# Patient Record
Sex: Female | Born: 1950 | Race: White | Hispanic: No | Marital: Married | Smoking: Never smoker
Health system: Southern US, Community
[De-identification: ages and names within clinical notes are randomized; demographics above are authoritative.]

## PROBLEM LIST (undated history)

## (undated) DIAGNOSIS — I10 Essential (primary) hypertension: Secondary | ICD-10-CM

## (undated) DIAGNOSIS — R55 Syncope and collapse: Secondary | ICD-10-CM

## (undated) DIAGNOSIS — K635 Polyp of colon: Secondary | ICD-10-CM

## (undated) DIAGNOSIS — R112 Nausea with vomiting, unspecified: Secondary | ICD-10-CM

## (undated) DIAGNOSIS — A281 Cat-scratch disease: Secondary | ICD-10-CM

## (undated) DIAGNOSIS — M1990 Unspecified osteoarthritis, unspecified site: Secondary | ICD-10-CM

## (undated) DIAGNOSIS — F410 Panic disorder [episodic paroxysmal anxiety] without agoraphobia: Secondary | ICD-10-CM

## (undated) DIAGNOSIS — Z9889 Other specified postprocedural states: Secondary | ICD-10-CM

## (undated) DIAGNOSIS — Z9289 Personal history of other medical treatment: Secondary | ICD-10-CM

## (undated) DIAGNOSIS — K219 Gastro-esophageal reflux disease without esophagitis: Secondary | ICD-10-CM

## (undated) HISTORY — PX: DILATION AND CURETTAGE OF UTERUS: SHX78

## (undated) HISTORY — DX: Cat-scratch disease: A28.1

## (undated) HISTORY — DX: Unspecified osteoarthritis, unspecified site: M19.90

## (undated) HISTORY — DX: Essential (primary) hypertension: I10

## (undated) HISTORY — DX: Panic disorder (episodic paroxysmal anxiety): F41.0

## (undated) HISTORY — DX: Syncope and collapse: R55

## (undated) HISTORY — PX: GANGLION CYST EXCISION: SHX1691

## (undated) HISTORY — DX: Gastro-esophageal reflux disease without esophagitis: K21.9

## (undated) HISTORY — DX: Personal history of other medical treatment: Z92.89

## (undated) HISTORY — PX: OTHER SURGICAL HISTORY: SHX169

## (undated) HISTORY — PX: BREAST EXCISIONAL BIOPSY: SUR124

## (undated) HISTORY — DX: Polyp of colon: K63.5

## (undated) HISTORY — PX: COLONOSCOPY: SHX174

---

## 1971-06-11 HISTORY — PX: BREAST EXCISIONAL BIOPSY: SUR124

## 1982-06-10 HISTORY — PX: COLON SURGERY: SHX602

## 2004-01-27 ENCOUNTER — Encounter: Admission: RE | Admit: 2004-01-27 | Discharge: 2004-01-27 | Payer: Self-pay | Admitting: Family Medicine

## 2006-10-01 ENCOUNTER — Encounter: Admission: RE | Admit: 2006-10-01 | Discharge: 2006-10-01 | Payer: Self-pay | Admitting: Gastroenterology

## 2007-05-04 ENCOUNTER — Ambulatory Visit: Payer: Self-pay | Admitting: Vascular Surgery

## 2009-03-19 ENCOUNTER — Encounter: Admission: RE | Admit: 2009-03-19 | Discharge: 2009-03-19 | Payer: Self-pay | Admitting: Orthopedic Surgery

## 2009-10-22 ENCOUNTER — Encounter: Admission: RE | Admit: 2009-10-22 | Discharge: 2009-10-22 | Payer: Self-pay | Admitting: Orthopaedic Surgery

## 2010-02-20 ENCOUNTER — Encounter: Admission: RE | Admit: 2010-02-20 | Discharge: 2010-02-20 | Payer: Self-pay | Admitting: Family Medicine

## 2010-07-03 ENCOUNTER — Ambulatory Visit: Payer: Self-pay | Admitting: Cardiology

## 2010-10-23 NOTE — Procedures (Signed)
RENAL ARTERY DUPLEX EVALUATION   INDICATION:  Uncontrolled hypertension.   HISTORY:  Diabetes:  No.  Cardiac:  No.  Hypertension:  Yes.  Smoking:  No.   RENAL ARTERY DUPLEX FINDINGS:  Aorta-Proximal:  76 cm/s  Aorta-Mid:  101 cm/s  Aorta-Distal:  110 cm/s  Celiac Artery Origin:  149 cm/s  SMA Origin:  115 cm/s                                    RIGHT               LEFT  Renal Artery Origin:             95 cm/s             154 cm/s  Renal Artery Proximal:           92 cm/s             128 cm/s  Renal Artery Mid:                83 cm/s             122 cm/s  Renal Artery Distal:             66 cm/s             69 cm/s  Hilar Acceleration Time (AT):    m/s2                m/s2  Renal-Aortic Ratio (RAR):        1.2                 2  Kidney Size:                     11.4 cm X  cm       11.6 cm X  cm  End Diastolic Ratio (EDR):  Resistive Index (RI):            0.61                0.72   IMPRESSION:  No evidence of significant renal artery stenosis noted  bilaterally.   Bilateral kidneys measured within normal limits (right measured 11.4 cm  and left measured 11.6 cm).  Bilateral resistive index within normal  limits.   ___________________________________________  Quita Skye Hart [place], M.D.   MG/MEDQ  D:  05/04/2007  T:  05/05/2007  Job:  [id number]

## 2011-08-14 ENCOUNTER — Other Ambulatory Visit: Payer: Self-pay | Admitting: Orthopaedic Surgery

## 2011-08-14 DIAGNOSIS — M545 Low back pain, unspecified: Secondary | ICD-10-CM

## 2011-08-17 ENCOUNTER — Ambulatory Visit
Admission: RE | Admit: 2011-08-17 | Discharge: 2011-08-17 | Disposition: A | Discharge status: Home / Self Care | Payer: 59 | Source: Ambulatory Visit | Attending: Orthopaedic Surgery | Admitting: Orthopaedic Surgery

## 2011-08-17 DIAGNOSIS — M545 Low back pain, unspecified: Secondary | ICD-10-CM

## 2011-10-18 ENCOUNTER — Encounter: Payer: Self-pay | Admitting: *Deleted

## 2012-02-20 ENCOUNTER — Encounter: Payer: Self-pay | Admitting: Cardiology

## 2012-11-08 DIAGNOSIS — R55 Syncope and collapse: Secondary | ICD-10-CM

## 2012-11-08 HISTORY — DX: Syncope and collapse: R55

## 2012-12-02 ENCOUNTER — Ambulatory Visit (INDEPENDENT_AMBULATORY_CARE_PROVIDER_SITE_OTHER): Payer: Managed Care, Other (non HMO) | Admitting: Physician Assistant

## 2012-12-02 ENCOUNTER — Encounter: Payer: Self-pay | Admitting: Physician Assistant

## 2012-12-02 VITALS — BP 127/89 | HR 84 | Ht 66.0 in | Wt 197.8 lb

## 2012-12-02 DIAGNOSIS — E669 Obesity, unspecified: Secondary | ICD-10-CM

## 2012-12-02 DIAGNOSIS — R55 Syncope and collapse: Secondary | ICD-10-CM

## 2012-12-02 DIAGNOSIS — I10 Essential (primary) hypertension: Secondary | ICD-10-CM

## 2012-12-02 NOTE — Patient Instructions (Addendum)
Will obtain labs today and call you with the results (bmet/cbc)  Your physician recommends that you continue on your current medications as directed. Please refer to the Current Medication list given to you today.  Your physician has requested that you have an echocardiogram. Echocardiography is a painless test that uses sound waves to create images of your heart. It provides your doctor with information about the size and shape of your heart and how well your heart's chambers and valves are working. This procedure takes approximately one hour. There are no restrictions for this procedure.  Your physician has recommended that you wear an event monitor. Event monitors are medical devices that record the heart's electrical activity. Doctors most often [place] these monitors to diagnose arrhythmias. Arrhythmias are problems with the speed or rhythm of the heartbeat. The monitor is a small, portable device. You can wear one while you do your normal daily activities. This is usually used to diagnose what is causing palpitations/syncope (passing out).  Return January 29, 2013 at 9:30 to see Dr Swaziland

## 2012-12-02 NOTE — Progress Notes (Addendum)
[address] [place], [place]  [address] Phone: [phone] Fax:  [phone]  Date:  12/02/2012   ID:  Sharon Combs, DOB 22-Apr-1951, MRN [id number]  PCP:  Ethel Rana  Cardiologist:  Dr. Peter Swaziland     History of Present Illness: Sharon Combs is a 62 y.o. female who returns for evaluation of syncope.   She has been followed in the past by Dr. Swaziland for hypertension. Last seen in 2012. She has used phentermine for weight loss and had recently started back on this per the bariatric clinic (according to the notes).  She was seen recently by her PCP after a syncopal episode. She is referred for further evaluation.  Patient notes she had been dieting (1200 kCal per day).  Two weeks ago, she awoke and showered as normal.  It was not a particularly hot shower.  She did not feel good.  Of note, she had had a HA off and on for 3 weeks.  She felt bad in the shower and lightheaded.  She got out and held on to the sink.  She felt near syncopal and then proceeded to pass out.  She hit the toilet and broke the seat.  States she hit her head but had a towel around her head.  She did presumably break a couple ribs on the right.  She felt confused upon awakening.  She felt weak for several hours.  Since she has felt well without recurrence.  She stopped the phentermine.  She denies chest pain, dyspnea, orthopnea, PND, edema.     Wt Readings from Last 3 Encounters:  12/02/12 197 lb 12.8 oz (89.721 kg)     Past Medical History  Diagnosis Date  . Hypertension     renal art dopplers 04/2007: normal  . GERD (gastroesophageal reflux disease)   . Panic attack   . DJD (degenerative joint disease)     cervical spine  . Cat scratch fever     as a child  . Colon polyps     Current Outpatient Prescriptions  Medication Sig Dispense Refill  . ALPRAZolam (XANAX) 0.25 MG tablet Take 0.25 mg by mouth at bedtime as needed.      . cyclobenzaprine (FLEXERIL) 10 MG tablet Take 10  mg by mouth as needed for muscle spasms.      Marland Kitchen estrogens, conjugated, (PREMARIN) 0.3 MG tablet Take 0.3 mg by mouth daily. Take daily for 21 days then do not take for 7 days.      Marland Kitchen HYDROcodone-acetaminophen (NORCO/VICODIN) 5-325 MG per tablet Take 1 tablet by mouth every 6 (six) hours as needed for pain.      . Misc Natural Products (OSTEO BI-FLEX JOINT SHIELD PO) Take by mouth daily.      . Multiple Vitamin (MULTIVITAMIN) tablet Take 1 tablet by mouth daily.      Marland Kitchen olmesartan-hydrochlorothiazide (BENICAR HCT) 40-25 MG per tablet Take 1 tablet by mouth daily.       No current facility-administered medications for this visit.    Allergies:    Allergies  Allergen Reactions  . Entex T (Pseudoephedrine-Guaifenesin)     Social History:  The patient  reports that she has never smoked. She does not have any smokeless tobacco history on file. She reports that she does not drink alcohol.   Family History:  The patient's family history includes Arrhythmia in her mother; Heart disease in her father; Hypertension in an unspecified family member; Pancreatic cancer in an  unspecified family member; and Pneumonia in an unspecified family member.   ROS:  Please see the history of present illness.      All other systems reviewed and negative.   PHYSICAL EXAM: VS:  BP 137/86  Pulse 84  Ht 5\' 6"  (1.676 m)  Wt 197 lb 12.8 oz (89.721 kg)  BMI 31.94 kg/m2  Filed Vitals:   12/02/12 1501 12/02/12 1502 12/02/12 1503 12/02/12 1504  BP: 121/86 137/86 132/92 127/89  Pulse: 83 84 84   Height:      Weight:         Well nourished, well developed, in no acute distress HEENT: normal Neck: no JVD Vascular:  No carotid bruits; carotid upstrokes normal bilat Endocrine:  No TM Cardiac:  normal S1, S2; RRR; 1/6 systolic murmur at RUSB Lungs:  clear to auscultation bilaterally, no wheezing, rhonchi or rales Abd: soft, nontender, no hepatomegaly Ext: no edema Skin: warm and dry Neuro:  CNs 2-12 intact, no  focal abnormalities noted  EKG:  NSR, HR 87, no change from prior tracings     ASSESSMENT AND PLAN:  1. Syncope:  Given hx of recent dieting with 1200 kCal per day, phentermine use, HAs (?viral syndrome) and prodrome, suspect vasovagal syncope.  However, she has a murmur on exam.  Will check echo to r/o reduced EF and valvular abnormalities.  She notes episodes of near syncope in the past.  She is not on AV nodal blocking agents.  Orthostatic VS normal today.  Will schedule an Event monitor to rule out arrhythmia.  It should be noted, that her episodes of syncope are very infrequent. Therefore a Holter monitor would not be appropriate. An event monitor would yield a better assessment.  Check CBC and BMET.   2. Hypertension:  Controlled.  Continue current therapy.  3. Obesity:  Recommend remaining off of phentermine for now.  4. Disposition:  F/u with Dr. Peter Swaziland in 4-6 weeks.    Signed, Tereso Newcomer, PA-C  12/02/2012 2:18 PM

## 2012-12-09 ENCOUNTER — Telehealth: Payer: Self-pay | Admitting: *Deleted

## 2012-12-09 ENCOUNTER — Ambulatory Visit (HOSPITAL_COMMUNITY): Payer: Managed Care, Other (non HMO) | Attending: Physician Assistant | Admitting: Radiology

## 2012-12-09 ENCOUNTER — Ambulatory Visit (INDEPENDENT_AMBULATORY_CARE_PROVIDER_SITE_OTHER): Payer: Managed Care, Other (non HMO) | Admitting: *Deleted

## 2012-12-09 ENCOUNTER — Encounter: Payer: Self-pay | Admitting: *Deleted

## 2012-12-09 ENCOUNTER — Encounter (INDEPENDENT_AMBULATORY_CARE_PROVIDER_SITE_OTHER): Payer: Managed Care, Other (non HMO)

## 2012-12-09 DIAGNOSIS — R011 Cardiac murmur, unspecified: Secondary | ICD-10-CM

## 2012-12-09 DIAGNOSIS — E669 Obesity, unspecified: Secondary | ICD-10-CM | POA: Insufficient documentation

## 2012-12-09 DIAGNOSIS — R55 Syncope and collapse: Secondary | ICD-10-CM

## 2012-12-09 DIAGNOSIS — I10 Essential (primary) hypertension: Secondary | ICD-10-CM

## 2012-12-09 LAB — CBC WITH DIFFERENTIAL/PLATELET
Basophils Relative: 0.4 % (ref 0.0–3.0)
Eosinophils Absolute: 0 10*3/uL (ref 0.0–0.7)
Eosinophils Relative: 0.5 % (ref 0.0–5.0)
MCHC: 33.2 g/dL (ref 30.0–36.0)
MCV: 91.5 fl (ref 78.0–100.0)
Monocytes Absolute: 0.6 10*3/uL (ref 0.1–1.0)
Neutro Abs: 3.4 10*3/uL (ref 1.4–7.7)
Platelets: 202 10*3/uL (ref 150.0–400.0)
WBC: 5.9 10*3/uL (ref 4.5–10.5)

## 2012-12-09 LAB — BASIC METABOLIC PANEL
Calcium: 9.5 mg/dL (ref 8.4–10.5)
Glucose, Bld: 101 mg/dL — ABNORMAL HIGH (ref 70–99)
Potassium: 3.4 mEq/L — ABNORMAL LOW (ref 3.5–5.1)

## 2012-12-09 NOTE — Progress Notes (Signed)
Echocardiogram performed.  

## 2012-12-09 NOTE — Telephone Encounter (Signed)
lmptcb to find out if she had her lab work done on 12/02/12, we do not see her results yet, but does state her labs have been collected

## 2012-12-09 NOTE — Telephone Encounter (Signed)
Message copied by Tarri Fuller on Wed Dec 09, 2012  8:34 AM ------      Message from: [place], [place] T      Created: Tue Dec 08, 2012  5:12 PM       Can we check on bmet and cbc results?      Thanks      Acupuncturist            ----- Message -----         From: SYSTEM         Sent: 12/07/2012  12:06 AM           To: Beatrice Lecher, PA-C                   ------

## 2012-12-09 NOTE — Progress Notes (Signed)
Patient ID: Sharon Combs, female   DOB: [birth year], 62 y.o.   MRN: [id number] E-Cardio Bramer 30 day event monitor applied to patient.

## 2012-12-10 ENCOUNTER — Other Ambulatory Visit: Payer: Self-pay

## 2012-12-10 MED ORDER — POTASSIUM CHLORIDE ER 10 MEQ PO TBCR: 10.0000 meq | EXTENDED_RELEASE_TABLET | Freq: Every day | ORAL | Status: DC

## 2012-12-11 ENCOUNTER — Encounter: Payer: Self-pay | Admitting: Physician Assistant

## 2012-12-14 ENCOUNTER — Telehealth: Payer: Self-pay | Admitting: *Deleted

## 2012-12-14 NOTE — Telephone Encounter (Signed)
Message copied by Tarri Fuller on Mon Dec 14, 2012 10:43 AM ------      Message from: [place], [place] T      Created: Fri Dec 11, 2012  7:44 PM       Please notify patient echocardiogram normal with normal LV function and normal valves      Continue with current treatment plan.      Tereso Newcomer, PA-C        12/11/2012 7:44 PM ------

## 2012-12-14 NOTE — Telephone Encounter (Signed)
lmom normal echo, no changes to be made

## 2013-01-19 ENCOUNTER — Telehealth: Payer: Self-pay | Admitting: Cardiology

## 2013-01-19 NOTE — Telephone Encounter (Signed)
Returned call to patient she stated her B/P has been low.Stated ranging from 114/70 to 96/65 pulse 87.Stated she feels tired.Advised Dr.Jordan out of office today will check with him tomorrow 01/20/13 and call her back.

## 2013-01-19 NOTE — Telephone Encounter (Signed)
New prob  Pt is concerned because her BP has been going as low at 96/65. She would like to speak with a nurse

## 2013-01-21 ENCOUNTER — Telehealth: Payer: Self-pay

## 2013-01-21 MED ORDER — OLMESARTAN MEDOXOMIL-HCTZ 20-12.5 MG PO TABS: 1.0000 | ORAL_TABLET | Freq: Every day | ORAL | Status: DC

## 2013-01-21 NOTE — Telephone Encounter (Signed)
Patient called Dr.Jordan reviewed monitor which revealed normal sinus rhythm.

## 2013-01-21 NOTE — Telephone Encounter (Signed)
Returned call to patient spoke to Dr.Jordan he advised to decrease benicar hct 20/12.5 mg daily.Advised to continue to monitor B/P,keep diary and bring to your appointment with Dr.Jordan 01/29/13.

## 2013-01-29 ENCOUNTER — Encounter: Payer: Self-pay | Admitting: Cardiology

## 2013-01-29 ENCOUNTER — Ambulatory Visit (INDEPENDENT_AMBULATORY_CARE_PROVIDER_SITE_OTHER): Payer: Managed Care, Other (non HMO) | Admitting: Cardiology

## 2013-01-29 VITALS — BP 124/72 | HR 79 | Ht 66.0 in | Wt 199.8 lb

## 2013-01-29 DIAGNOSIS — R55 Syncope and collapse: Secondary | ICD-10-CM

## 2013-01-29 DIAGNOSIS — I10 Essential (primary) hypertension: Secondary | ICD-10-CM

## 2013-01-29 LAB — BASIC METABOLIC PANEL
Chloride: 102 mEq/L (ref 96–112)
Creatinine, Ser: 0.9 mg/dL (ref 0.4–1.2)
Sodium: 137 mEq/L (ref 135–145)

## 2013-01-29 NOTE — Patient Instructions (Signed)
We will check your potassium today.  Continue your current therapy  I will see you in 6 months.

## 2013-01-29 NOTE — Progress Notes (Signed)
Sharon Combs Date of Birth: 1950-07-20 Medical Record #[id number]  History of Present Illness: Sharon Combs is seen today for followup. She has a history of hypertension. In June she had an episode of syncope. She had just gotten out of a shower. She suddenly felt hot and lightheaded and passed out hitting her chest against the vanity. She had been taking phentermine for weight loss and this was stopped. She was not significantly orthostatic. Subsequent evaluation with an echocardiogram was normal. She also wore an event monitor which was normal. She has had no recurrent episodes of syncope or near syncope. Her Benicar HCT was reduced. Blood pressure has remained in good control. She was mildly hypokalemic and her potassium has been repleted.  Current Outpatient Prescriptions on File Prior to Visit  Medication Sig Dispense Refill  . ALPRAZolam (XANAX) 0.25 MG tablet Take 0.25 mg by mouth at bedtime as needed.      . cyclobenzaprine (FLEXERIL) 10 MG tablet Take 10 mg by mouth as needed for muscle spasms.      Marland Kitchen estrogens, conjugated, (PREMARIN) 0.3 MG tablet Take 0.3 mg by mouth daily. Take daily for 21 days then do not take for 7 days.      Marland Kitchen HYDROcodone-acetaminophen (NORCO/VICODIN) 5-325 MG per tablet Take 1 tablet by mouth every 6 (six) hours as needed for pain.      . Misc Natural Products (OSTEO BI-FLEX JOINT SHIELD PO) Take by mouth daily.      . Multiple Vitamin (MULTIVITAMIN) tablet Take 1 tablet by mouth daily.      Marland Kitchen olmesartan-hydrochlorothiazide (BENICAR HCT) 20-12.5 MG per tablet Take 1 tablet by mouth daily.  90 tablet  3  . potassium chloride (K-DUR) 10 MEQ tablet Take 1 tablet (10 mEq total) by mouth daily.  30 tablet  6   No current facility-administered medications on file prior to visit.    Allergies  Allergen Reactions  . Entex T [Pseudoephedrine-Guaifenesin]     Past Medical History  Diagnosis Date  . Hypertension     renal art dopplers 04/2007: normal  . GERD  (gastroesophageal reflux disease)   . Panic attack   . DJD (degenerative joint disease)     cervical spine  . Cat scratch fever     as a child  . Colon polyps   . History of echocardiogram     Echo 7/14: EF 60-65%, normal wall motion    Past Surgical History  Procedure Laterality Date  . Total hysterectomy - unknown type      History  Smoking status  . Never Smoker   Smokeless tobacco  . Not on file    History  Alcohol Use No    Family History  Problem Relation Age of Onset  . Pneumonia    . Pancreatic cancer    . Hypertension    . Arrhythmia Mother     "tachycardia"  . Heart disease Father     Review of Systems: As noted in history of present illness.  All other systems were reviewed and are negative.  Physical Exam: BP 124/72  Pulse 79  Ht 5\' 6"  (1.676 m)  Wt 199 lb 12.8 oz (90.629 kg)  BMI 32.26 kg/m2  SpO2 98% She is a pleasant white female in no acute distress. HEENT: Normal Neck: No JVD or bruits. No adenopathy. Lungs: Clear Cardiac: Regular rate and rhythm. Normal S1 and S2. No gallop, murmur, or click. Abdomen: Soft and nontender. No masses or bruits. Extremities: No  cyanosis or edema. Pulses are 2+. Skin: Warm and dry Neuro: Alert and oriented x3. Cranial nerves II through XII are intact. LABORATORY DATA:   Assessment / Plan: 1. Syncopal episode. Probably related to vasodilatation and maybe some volume contraction with her HCTZ. No recurrent symptoms. Blood pressure is well controlled on lower dose of medication. 2. Hypertension-well controlled  Plan: We'll continue to observe for now. She is to call if she has any recurrent syncopal episodes. We will check a basic metabolic panel today. I will followup in 6 months.

## 2013-06-25 ENCOUNTER — Telehealth: Payer: Self-pay | Admitting: Cardiology

## 2013-06-25 NOTE — Telephone Encounter (Signed)
Pt states she has not been feeling well for 1 week. C/o face pressure/ ears red and had an episode of dizziness and nausea. Pt states yesterday her bp was 139/102, pt had Benicar hctz reduced due to hypotension.  Today 151/95 pt took bp meds 30 minutes ago/ pt is now at work and can not get another bp. High sodium foods reviewed, pt seems to avoid sodium rich foods. Caffeine reviewed, pt drinks coffe in am/ 1-2 cups. Pt was advised to get 3 bp/p readings daily over the weekend and call Monday with the results to Dr Lolita RiegerJordan/ nurse. Pt was told to walk 20 minutes a day to reduce stress. Pt concerned about MI because of SX of nausea and dizziness, pt was reassured that nausea and dizziness can be from a vas number of reasons but Her BP may have been elevated at the time, pt denies sob/ cp/ arm pain/ no other symptoms. Pt was told to call with further concerns over the weekend. Pt verbalized understanding and was accepting of plan.

## 2013-06-25 NOTE — Telephone Encounter (Signed)
New message  Patient is very concerned about her elevated BP. This has been going on since yesterday. Please call and advise.

## 2013-07-02 NOTE — Telephone Encounter (Signed)
Patient called no answer.LMTC. 

## 2013-07-15 NOTE — Telephone Encounter (Signed)
Patient called no answer.Left message on personal answering machine, calling to see how you are doing and need to call me back time for your follow up appointment with Dr.Jordan.

## 2013-07-16 ENCOUNTER — Other Ambulatory Visit: Payer: Self-pay | Admitting: Cardiology

## 2013-07-22 ENCOUNTER — Telehealth: Payer: Self-pay | Admitting: Cardiology

## 2013-07-22 NOTE — Telephone Encounter (Signed)
Returned call to patient she stated she is feeling better, B/P is up and down.Occasional dizziness.Follow up appointment scheduled with Dr.Jordan 08/27/13.

## 2013-07-22 NOTE — Telephone Encounter (Signed)
New message  ° ° °Returning Cheryls call  °

## 2013-08-21 ENCOUNTER — Other Ambulatory Visit: Payer: Self-pay | Admitting: Cardiology

## 2013-08-27 ENCOUNTER — Ambulatory Visit (INDEPENDENT_AMBULATORY_CARE_PROVIDER_SITE_OTHER): Payer: Managed Care, Other (non HMO) | Admitting: Cardiology

## 2013-08-27 ENCOUNTER — Encounter: Payer: Self-pay | Admitting: Cardiology

## 2013-08-27 VITALS — BP 129/84 | HR 90 | Ht 66.0 in | Wt 201.8 lb

## 2013-08-27 DIAGNOSIS — R55 Syncope and collapse: Secondary | ICD-10-CM

## 2013-08-27 DIAGNOSIS — I10 Essential (primary) hypertension: Secondary | ICD-10-CM

## 2013-08-27 NOTE — Patient Instructions (Signed)
Continue your current therapy  I will see you as needed. 

## 2013-08-28 NOTE — Progress Notes (Signed)
Sharon EddyEvelyn S Combs Date of Birth: 03/29/1951 Medical Record #[id number]  History of Present Illness: Sharon Combs is seen today for followup. She has a history of hypertension. In June 2014 she had an episode of syncope.  She was not significantly orthostatic. Subsequent evaluation with an echocardiogram was normal. She also wore an event monitor which was normal.  Her Benicar HCT was reduced. Blood pressure has remained in good control. She was mildly hypokalemic and her potassium has been repleted. She has no recurrent lightheadedness or syncope. She does complain of some nausea and allergies.   Current Outpatient Prescriptions on File Prior to Visit  Medication Sig Dispense Refill  . ALPRAZolam (XANAX) 0.25 MG tablet Take 0.25 mg by mouth at bedtime as needed.      . cyclobenzaprine (FLEXERIL) 10 MG tablet Take 10 mg by mouth as needed for muscle spasms.      Marland Kitchen. estrogens, conjugated, (PREMARIN) 0.3 MG tablet Take 0.3 mg by mouth daily. Take daily for 21 days then do not take for 7 days.      Marland Kitchen. KLOR-CON 10 10 MEQ tablet TAKE 1 TABLET EVERY DAY  30 tablet  0  . Misc Natural Products (OSTEO BI-FLEX JOINT SHIELD PO) Take by mouth daily.      . Multiple Vitamin (MULTIVITAMIN) tablet Take 1 tablet by mouth daily.      Marland Kitchen. olmesartan-hydrochlorothiazide (BENICAR HCT) 20-12.5 MG per tablet Take 1 tablet by mouth daily.  90 tablet  3   No current facility-administered medications on file prior to visit.    Allergies  Allergen Reactions  . Entex T [Pseudoephedrine-Guaifenesin]     Past Medical History  Diagnosis Date  . Hypertension     renal art dopplers 04/2007: normal  . GERD (gastroesophageal reflux disease)   . Panic attack   . DJD (degenerative joint disease)     cervical spine  . Cat scratch fever     as a child  . Colon polyps   . History of echocardiogram     Echo 7/14: EF 60-65%, normal wall motion  . Syncope 6/14    Past Surgical History  Procedure Laterality Date  . Total  hysterectomy - unknown type      History  Smoking status  . Never Smoker   Smokeless tobacco  . Not on file    History  Alcohol Use No    Family History  Problem Relation Age of Onset  . Pneumonia    . Pancreatic cancer    . Hypertension    . Arrhythmia Mother     "tachycardia"  . Heart disease Father     Review of Systems: As noted in history of present illness.  All other systems were reviewed and are negative.  Physical Exam: BP 129/84  Pulse 90  Ht 5\' 6"  (1.676 m)  Wt 201 lb 12.8 oz (91.536 kg)  BMI 32.59 kg/m2 She is a pleasant white female in no acute distress. HEENT: Normal Neck: No JVD or bruits. No adenopathy. Lungs: Clear Cardiac: Regular rate and rhythm. Normal S1 and S2. No gallop, murmur, or click. Abdomen: Soft and nontender. No masses or bruits. Extremities: No cyanosis or edema. Pulses are 2+. Skin: Warm and dry Neuro: Alert and oriented x3. Cranial nerves II through XII are intact. LABORATORY DATA:   Assessment / Plan: 1. Syncopal episode. Probably related to vasodilatation and maybe some volume contraction with  HCTZ. No recurrent symptoms. Blood pressure is well controlled on lower dose of  medication. No further cardiac workup indicated. She will follow up with her primary care and I will see her as needed.  2. Hypertension-well controlled

## 2013-09-24 ENCOUNTER — Other Ambulatory Visit: Payer: Self-pay | Admitting: Cardiology

## 2013-10-01 ENCOUNTER — Other Ambulatory Visit: Payer: Self-pay | Admitting: Physician Assistant

## 2013-10-01 DIAGNOSIS — Z1231 Encounter for screening mammogram for malignant neoplasm of breast: Secondary | ICD-10-CM

## 2013-10-08 ENCOUNTER — Other Ambulatory Visit: Payer: Self-pay | Admitting: Orthopedic Surgery

## 2013-10-14 ENCOUNTER — Ambulatory Visit
Admission: RE | Admit: 2013-10-14 | Discharge: 2013-10-14 | Disposition: A | Discharge status: Home / Self Care | Payer: Managed Care, Other (non HMO) | Source: Ambulatory Visit | Attending: Physician Assistant | Admitting: Physician Assistant

## 2013-10-14 ENCOUNTER — Encounter (INDEPENDENT_AMBULATORY_CARE_PROVIDER_SITE_OTHER): Payer: Self-pay

## 2013-10-14 DIAGNOSIS — Z1231 Encounter for screening mammogram for malignant neoplasm of breast: Secondary | ICD-10-CM

## 2013-10-20 ENCOUNTER — Encounter (HOSPITAL_BASED_OUTPATIENT_CLINIC_OR_DEPARTMENT_OTHER): Payer: Self-pay | Admitting: *Deleted

## 2013-10-20 NOTE — Progress Notes (Signed)
Working-will need istat-ekg done 7/14

## 2013-10-21 ENCOUNTER — Other Ambulatory Visit: Payer: Self-pay | Admitting: Orthopedic Surgery

## 2013-10-22 ENCOUNTER — Ambulatory Visit (HOSPITAL_BASED_OUTPATIENT_CLINIC_OR_DEPARTMENT_OTHER): Payer: Managed Care, Other (non HMO) | Admitting: Anesthesiology

## 2013-10-22 ENCOUNTER — Encounter (HOSPITAL_BASED_OUTPATIENT_CLINIC_OR_DEPARTMENT_OTHER): Payer: Managed Care, Other (non HMO) | Admitting: Anesthesiology

## 2013-10-22 ENCOUNTER — Encounter (HOSPITAL_BASED_OUTPATIENT_CLINIC_OR_DEPARTMENT_OTHER): Payer: Self-pay | Admitting: Orthopedic Surgery

## 2013-10-22 ENCOUNTER — Encounter (HOSPITAL_BASED_OUTPATIENT_CLINIC_OR_DEPARTMENT_OTHER)
Admission: RE | Disposition: A | Discharge status: Home / Self Care | Payer: Self-pay | Source: Ambulatory Visit | Attending: Orthopedic Surgery

## 2013-10-22 ENCOUNTER — Ambulatory Visit (HOSPITAL_BASED_OUTPATIENT_CLINIC_OR_DEPARTMENT_OTHER)
Admission: RE | Admit: 2013-10-22 | Discharge: 2013-10-22 | Disposition: A | Discharge status: Home / Self Care | Payer: Managed Care, Other (non HMO) | Source: Ambulatory Visit | Attending: Orthopedic Surgery | Admitting: Orthopedic Surgery

## 2013-10-22 DIAGNOSIS — M6740 Ganglion, unspecified site: Secondary | ICD-10-CM | POA: Insufficient documentation

## 2013-10-22 DIAGNOSIS — I252 Old myocardial infarction: Secondary | ICD-10-CM | POA: Insufficient documentation

## 2013-10-22 DIAGNOSIS — I10 Essential (primary) hypertension: Secondary | ICD-10-CM | POA: Insufficient documentation

## 2013-10-22 DIAGNOSIS — K219 Gastro-esophageal reflux disease without esophagitis: Secondary | ICD-10-CM | POA: Insufficient documentation

## 2013-10-22 DIAGNOSIS — I509 Heart failure, unspecified: Secondary | ICD-10-CM | POA: Insufficient documentation

## 2013-10-22 DIAGNOSIS — F410 Panic disorder [episodic paroxysmal anxiety] without agoraphobia: Secondary | ICD-10-CM | POA: Insufficient documentation

## 2013-10-22 HISTORY — PX: MASS EXCISION: SHX2000

## 2013-10-22 HISTORY — DX: Other specified postprocedural states: Z98.890

## 2013-10-22 HISTORY — DX: Nausea with vomiting, unspecified: R11.2

## 2013-10-22 LAB — POCT I-STAT, CHEM 8
BUN: 10 mg/dL (ref 6–23)
CREATININE: 1 mg/dL (ref 0.50–1.10)
Calcium, Ion: 1.1 mmol/L — ABNORMAL LOW (ref 1.13–1.30)
Chloride: 103 mEq/L (ref 96–112)
GLUCOSE: 99 mg/dL (ref 70–99)
HCT: 44 % (ref 36.0–46.0)
Hemoglobin: 15 g/dL (ref 12.0–15.0)
POTASSIUM: 3.3 meq/L — AB (ref 3.7–5.3)
SODIUM: 140 meq/L (ref 137–147)
TCO2: 25 mmol/L (ref 0–100)

## 2013-10-22 SURGERY — EXCISION MASS
Anesthesia: Monitor Anesthesia Care | Site: Finger | Laterality: Right

## 2013-10-22 MED ORDER — CEFAZOLIN SODIUM-DEXTROSE 2-3 GM-% IV SOLR
INTRAVENOUS | Status: AC
  Filled 2013-10-22: qty 50

## 2013-10-22 MED ORDER — BUPIVACAINE HCL (PF) 0.25 % IJ SOLN
INTRAMUSCULAR | Status: DC | PRN
  Administered 2013-10-22: 6 mL

## 2013-10-22 MED ORDER — PROPOFOL INFUSION 10 MG/ML OPTIME
INTRAVENOUS | Status: DC | PRN
  Administered 2013-10-22: 100 ug/kg/min via INTRAVENOUS

## 2013-10-22 MED ORDER — HYDROCODONE-ACETAMINOPHEN 5-325 MG PO TABS: 1.0000 | ORAL_TABLET | Freq: Four times a day (QID) | ORAL | Status: AC | PRN

## 2013-10-22 MED ORDER — FENTANYL CITRATE 0.05 MG/ML IJ SOLN
INTRAMUSCULAR | Status: DC | PRN
  Administered 2013-10-22: 50 ug via INTRAVENOUS

## 2013-10-22 MED ORDER — OXYCODONE HCL 5 MG PO TABS
5.0000 mg | ORAL_TABLET | ORAL | Status: DC | PRN
  Administered 2013-10-22: 5 mg via ORAL

## 2013-10-22 MED ORDER — ONDANSETRON HCL 4 MG/2ML IJ SOLN
INTRAMUSCULAR | Status: DC | PRN
  Administered 2013-10-22: 4 mg via INTRAVENOUS

## 2013-10-22 MED ORDER — LACTATED RINGERS IV SOLN
INTRAVENOUS | Status: DC
  Administered 2013-10-22 (×2): via INTRAVENOUS

## 2013-10-22 MED ORDER — CHLORHEXIDINE GLUCONATE 4 % EX LIQD: 60.0000 mL | Freq: Once | CUTANEOUS | Status: DC

## 2013-10-22 MED ORDER — CEFAZOLIN SODIUM-DEXTROSE 2-3 GM-% IV SOLR: 2.0000 g | INTRAVENOUS | Status: DC

## 2013-10-22 MED ORDER — MIDAZOLAM HCL 2 MG/2ML IJ SOLN
INTRAMUSCULAR | Status: AC
  Filled 2013-10-22: qty 2

## 2013-10-22 MED ORDER — LIDOCAINE HCL (CARDIAC) 20 MG/ML IV SOLN
INTRAVENOUS | Status: DC | PRN
  Administered 2013-10-22: 50 mg via INTRAVENOUS

## 2013-10-22 MED ORDER — MIDAZOLAM HCL 2 MG/2ML IJ SOLN: 1.0000 mg | INTRAMUSCULAR | Status: DC | PRN

## 2013-10-22 MED ORDER — FENTANYL CITRATE 0.05 MG/ML IJ SOLN
INTRAMUSCULAR | Status: AC
  Filled 2013-10-22: qty 4

## 2013-10-22 MED ORDER — PROPOFOL 10 MG/ML IV BOLUS
INTRAVENOUS | Status: AC
  Filled 2013-10-22: qty 20

## 2013-10-22 MED ORDER — FENTANYL CITRATE 0.05 MG/ML IJ SOLN: 50.0000 ug | INTRAMUSCULAR | Status: DC | PRN

## 2013-10-22 MED ORDER — MIDAZOLAM HCL 5 MG/5ML IJ SOLN
INTRAMUSCULAR | Status: DC | PRN
  Administered 2013-10-22 (×2): 1 mg via INTRAVENOUS

## 2013-10-22 MED ORDER — OXYCODONE HCL 5 MG PO TABS
ORAL_TABLET | ORAL | Status: AC
  Filled 2013-10-22: qty 1

## 2013-10-22 MED ORDER — CEFAZOLIN SODIUM-DEXTROSE 2-3 GM-% IV SOLR
2.0000 g | INTRAVENOUS | Status: AC
  Administered 2013-10-22: 2 g via INTRAVENOUS

## 2013-10-22 SURGICAL SUPPLY — 58 items
BANDAGE COBAN STERILE 2 (GAUZE/BANDAGES/DRESSINGS) IMPLANT
BLADE MINI RND TIP GREEN BEAV (BLADE) IMPLANT
BLADE SURG 15 STRL LF DISP TIS (BLADE) ×5 IMPLANT
BLADE SURG 15 STRL SS (BLADE) ×6
BNDG CMPR 9X4 STRL LF SNTH (GAUZE/BANDAGES/DRESSINGS)
BNDG COHESIVE 1X5 TAN STRL LF (GAUZE/BANDAGES/DRESSINGS) ×2 IMPLANT
BNDG COHESIVE 3X5 TAN STRL LF (GAUZE/BANDAGES/DRESSINGS) ×1 IMPLANT
BNDG ESMARK 4X9 LF (GAUZE/BANDAGES/DRESSINGS) ×1 IMPLANT
BNDG GAUZE ELAST 4 BULKY (GAUZE/BANDAGES/DRESSINGS) IMPLANT
CHLORAPREP W/TINT 26ML (MISCELLANEOUS) ×3 IMPLANT
CORDS BIPOLAR (ELECTRODE) ×3 IMPLANT
COVER MAYO STAND STRL (DRAPES) ×3 IMPLANT
COVER TABLE BACK 60X90 (DRAPES) ×3 IMPLANT
CUFF TOURNIQUET SINGLE 18IN (TOURNIQUET CUFF) ×2 IMPLANT
DECANTER SPIKE VIAL GLASS SM (MISCELLANEOUS) IMPLANT
DRAIN PENROSE 1/2X12 LTX STRL (WOUND CARE) IMPLANT
DRAPE EXTREMITY T 121X128X90 (DRAPE) ×3 IMPLANT
DRAPE OEC MINIVIEW 54X84 (DRAPES) ×1 IMPLANT
DRAPE SURG 17X23 STRL (DRAPES) ×3 IMPLANT
GAUZE SPONGE 4X4 12PLY STRL (GAUZE/BANDAGES/DRESSINGS) ×3 IMPLANT
GAUZE XEROFORM 1X8 LF (GAUZE/BANDAGES/DRESSINGS) ×3 IMPLANT
GLOVE BIOGEL M STRL SZ7.5 (GLOVE) ×2 IMPLANT
GLOVE BIOGEL PI IND STRL 8 (GLOVE) ×1 IMPLANT
GLOVE BIOGEL PI IND STRL 8.5 (GLOVE) ×2 IMPLANT
GLOVE BIOGEL PI INDICATOR 8 (GLOVE) ×1
GLOVE BIOGEL PI INDICATOR 8.5 (GLOVE) ×1
GLOVE SURG ORTHO 8.0 STRL STRW (GLOVE) ×3 IMPLANT
GOWN STRL REUS W/ TWL LRG LVL3 (GOWN DISPOSABLE) ×2 IMPLANT
GOWN STRL REUS W/TWL LRG LVL3 (GOWN DISPOSABLE) ×3
GOWN STRL REUS W/TWL XL LVL3 (GOWN DISPOSABLE) ×5 IMPLANT
NDL BLD DRAW 23GX1  MC LAB (NEEDLE) IMPLANT
NDL SAFETY ECLIPSE 18X1.5 (NEEDLE) IMPLANT
NEEDLE 27GAX1X1/2 (NEEDLE) ×2 IMPLANT
NEEDLE BLD DRAW 23GX1   MC LAB (NEEDLE) ×1
NEEDLE BLD DRAW 23GX1  MC LAB (NEEDLE) ×2 IMPLANT
NEEDLE HYPO 18GX1.5 SHARP (NEEDLE)
NS IRRIG 1000ML POUR BTL (IV SOLUTION) ×3 IMPLANT
PACK BASIN DAY SURGERY FS (CUSTOM PROCEDURE TRAY) ×3 IMPLANT
PAD CAST 3X4 CTTN HI CHSV (CAST SUPPLIES) ×1 IMPLANT
PADDING CAST ABS 3INX4YD NS (CAST SUPPLIES)
PADDING CAST ABS 4INX4YD NS (CAST SUPPLIES)
PADDING CAST ABS COTTON 3X4 (CAST SUPPLIES) IMPLANT
PADDING CAST ABS COTTON 4X4 ST (CAST SUPPLIES) ×1 IMPLANT
PADDING CAST COTTON 3X4 STRL (CAST SUPPLIES)
SLEEVE SCD COMPRESS KNEE MED (MISCELLANEOUS) IMPLANT
SPLINT PLASTER CAST XFAST 3X15 (CAST SUPPLIES) IMPLANT
SPLINT PLASTER XTRA FASTSET 3X (CAST SUPPLIES)
STOCKINETTE 4X48 STRL (DRAPES) ×3 IMPLANT
SUT CHROMIC 5 0 P 3 (SUTURE) IMPLANT
SUT MERSILENE 4 0 P 3 (SUTURE) IMPLANT
SUT VIC AB 4-0 P2 18 (SUTURE) IMPLANT
SUT VICRYL 4-0 PS2 18IN ABS (SUTURE) IMPLANT
SUT VICRYL RAPID 5 0 P 3 (SUTURE) IMPLANT
SUT VICRYL RAPIDE 4/0 PS 2 (SUTURE) ×3 IMPLANT
SYR BULB 3OZ (MISCELLANEOUS) ×3 IMPLANT
SYR CONTROL 10ML LL (SYRINGE) ×2 IMPLANT
TOWEL OR 17X24 6PK STRL BLUE (TOWEL DISPOSABLE) ×3 IMPLANT
UNDERPAD 30X30 INCONTINENT (UNDERPADS AND DIAPERS) ×3 IMPLANT

## 2013-10-22 NOTE — Discharge Instructions (Addendum)

## 2013-10-22 NOTE — Op Note (Signed)
Dictation Number [id number]

## 2013-10-22 NOTE — H&P (Signed)
Sharon Combs is a 63 year old right hand dominant female with a mass in the dorsal aspect right small finger DIP joint. This has been present for at least 3 months to her knowledge. She has formed a groove to the entire length of the nail plate. She recalls no history of injury. She states she has a history of arthritis. No history of diabetes, thyroid problems, or gout. She is not complaining of any significant pain or discomfort. This has not been opened. She states she has occasional mild aching with a feeling of swelling. She is not taking anything for this.   PAST MEDICAL HISTORY: She has no known drug allergies. She is on Xanax, Chlor con, Ocuvite, Benicar, Centrum Silver and Zantac. She has had 2 cysts removed from [place] in [place]. She had a colon resection and complete hysterectomy.  FAMILY H ISTORY: Positive for heart disease, high BP and arthritis.  SOCIAL HISTORY: She does not smoke or drink. 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   REVIEW OF SYSTEMS: Positive for contacts, high BP, Hepatitis, stomach ulcer, and lump. Sharon Combs is an 63 y.o. female.   Chief Complaint: Mucoid cyst Right small finger HPI: see above  Past Medical History  Diagnosis Date  . Hypertension     renal art dopplers 04/2007: normal  . GERD (gastroesophageal reflux disease)   . Panic attack   . DJD (degenerative joint disease)     cervical spine  . Cat scratch fever     as a child  . Colon polyps   . History of echocardiogram     Echo 7/14: EF 60-65%, normal wall motion  . Syncope 6/14  . PONV (postoperative nausea and vomiting)     hard to wake up    Past Surgical History  Procedure Laterality Date  . Total hysterectomy - unknown type    . Dilation and curettage of uterus    . Colon surgery  1984    colon resection  . Ganglion cyst excision      both wrists  . Colonoscopy      Family History  Problem Relation Age of Onset  . Pneumonia    . Pancreatic  cancer    . Hypertension    . Arrhythmia Mother     "tachycardia"  . Heart disease Father    Social History:  reports that she has never smoked. She does not have any smokeless tobacco history on file. She reports that she does not drink alcohol. Her drug history is not on file.  Allergies:  Allergies  Allergen Reactions  . Entex T [Pseudoephedrine-Guaifenesin]     Medications Prior to Admission  Medication Sig Dispense Refill  . potassium chloride (K-DUR,KLOR-CON) 10 MEQ tablet Take 10 mEq by mouth once.      . ranitidine (ZANTAC) 150 MG capsule Take 150 mg by mouth 2 (two) times daily.      . ACETAMINOPHEN PO Take by mouth as directed.      Marland Kitchen. ALPRAZolam (XANAX) 0.25 MG tablet Take 0.25 mg by mouth at bedtime as needed.      . cyclobenzaprine (FLEXERIL) 10 MG tablet Take 10 mg by mouth as needed for muscle spasms.      Marland Kitchen. estrogens, conjugated, (PREMARIN) 0.3 MG tablet Take 0.3 mg by mouth daily. Take daily for 21 days then do not take for 7 days.      Marland Kitchen. KLOR-CON 10 10 MEQ tablet TAKE 1 TABLET EVERY  DAY  30 tablet  11  . Misc Natural Products (OSTEO BI-FLEX JOINT SHIELD PO) Take by mouth daily.      . Multiple Vitamin (MULTIVITAMIN) tablet Take 1 tablet by mouth daily.      Marland Kitchen. olmesartan-hydrochlorothiazide (BENICAR HCT) 20-12.5 MG per tablet Take 1 tablet by mouth daily.  90 tablet  3    No results found for this or any previous visit (from the past 48 hour(s)).  No results found.   Pertinent items are noted in HPI.  There were no vitals taken for this visit.  General appearance: alert, cooperative and appears stated age Head: Normocephalic, without obvious abnormality Neck: no JVD Resp: clear to auscultation bilaterally Cardio: regular rate and rhythm, S1, S2 normal, no murmur, click, rub or gallop GI: soft, non-tender; bowel sounds normal; no masses,  no organomegaly Extremities: cyst right small finger Pulses: 2+ and symmetric Skin: Skin color, texture, turgor normal.  No rashes or lesions Neurologic: Grossly normal Incision/Wound: na  Assessment/Plan X-rays reveal narrowing of the joint space, no large osteophytes.  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   She is also noted to have arthritic changes at the [facility] of her thumb. We would recommend surgical excision of the mucoid cyst with debridement synovectomy of the DIP joint right small finger. The pre, peri and post op course are discussed along with risks and complications.  She is aware there is no guarantee with surgery, possibility of infection, recurrence, injury to arteries, nerves and tendons, incomplete relief of symptoms and dystrophy.  She is advised of possibility of stiffness to the joint and recurrence of the cyst as long as she has degenerative changes present. We have also discussed with her various treatment alternatives for her CMC arthritis. She is scheduled for excision mucoid cyst, debridement of DIP joint right small finger as an outpatient under regional anesthesia.   Nicki ReaperGary R Letica Giaimo 10/22/2013, 12:32 PM

## 2013-10-22 NOTE — Anesthesia Preprocedure Evaluation (Signed)
Anesthesia Evaluation  Patient identified by MRN, date of birth, ID band Patient awake    Reviewed: Allergy & Precautions, H&P , NPO status , Patient's Chart, lab work & pertinent test results  History of Anesthesia Complications (+) PONV and history of anesthetic complications  Airway Mallampati: II TM Distance: >3 FB Neck ROM: Full    Dental  (+) Teeth Intact   Pulmonary neg pulmonary ROS,          Cardiovascular hypertension, Pt. on medications - angina- Past MI and - CHF - dysrhythmias - Valvular Problems/MurmursRhythm:Regular     Neuro/Psych negative neurological ROS     GI/Hepatic Neg liver ROS, GERD-  Medicated and Controlled,  Endo/Other  negative endocrine ROS  Renal/GU negative Renal ROS     Musculoskeletal negative musculoskeletal ROS (+)   Abdominal   Peds  Hematology negative hematology ROS (+)   Anesthesia Other Findings   Reproductive/Obstetrics                           Anesthesia Physical Anesthesia Plan  ASA: II  Anesthesia Plan: Bier Block and MAC   Post-op Pain Management:    Induction:   Airway Management Planned: Natural Airway and Simple Face Mask  Additional Equipment: None  Intra-op Plan:   Post-operative Plan:   Informed Consent: I have reviewed the patients History and Physical, chart, labs and discussed the procedure including the risks, benefits and alternatives for the proposed anesthesia with the patient or authorized representative who has indicated his/her understanding and acceptance.   Dental advisory given  Plan Discussed with: CRNA and Surgeon  Anesthesia Plan Comments:         Anesthesia Quick Evaluation

## 2013-10-22 NOTE — Anesthesia Postprocedure Evaluation (Signed)
  Anesthesia Post-op Note  Patient: Sharon Combs  Procedure(s) Performed: Procedure(s) with comments: EXCISION MUCOID TUMOR RIGHT SMALL FINGER (Right) - ANESTHESIA:  IV REGIONAL FAB  Patient Location: PACU  Anesthesia Type:MAC and Bier block  Level of Consciousness: awake and alert   Airway and Oxygen Therapy: Patient Spontanous Breathing  Post-op Pain: none  Post-op Assessment: Post-op Vital signs reviewed, Patient's Cardiovascular Status Stable, Respiratory Function Stable, Patent Airway, No signs of Nausea or vomiting and Pain level controlled  Post-op Vital Signs: Reviewed and stable  Last Vitals:  Filed Vitals:   10/22/13 1645  BP: 139/83  Pulse: 87  Temp:   Resp: 17    Complications: No apparent anesthesia complications

## 2013-10-22 NOTE — Transfer of Care (Signed)
Immediate Anesthesia Transfer of Care Note  Patient: Sharon Combs  Procedure(s) Performed: Procedure(s) with comments: EXCISION MUCOID TUMOR RIGHT SMALL FINGER (Right) - ANESTHESIA:  IV REGIONAL FAB  Patient Location: PACU  Anesthesia Type:Bier block  Level of Consciousness: awake, alert  and oriented  Airway & Oxygen Therapy: Patient Spontanous Breathing and Patient connected to face mask oxygen  Post-op Assessment: Report given to PACU RN and Post -op Vital signs reviewed and stable  Post vital signs: Reviewed and stable  Complications: No apparent anesthesia complications

## 2013-10-22 NOTE — Brief Op Note (Signed)
10/22/2013  4:14 PM  PATIENT:  Sharon Combs  63 y.o. 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  POST-OPERATIVE DIAGNOSIS:  Tumor Distal Phalangeal Joint Right Small Finger  PROCEDURE:  Procedure(s) with comments: EXCISION MUCOID TUMOR RIGHT SMALL FINGER (Right) - ANESTHESIA:  IV REGIONAL FAB  SURGEON:  Surgeon(s) and Role:    * Nicki ReaperGary R Charise Leinbach, MD - Primary  PHYSICIAN ASSISTANT:   ASSISTANTS: none   ANESTHESIA:   local and regional  EBL:  Total I/O In: 1000 [I.V.:1000] Out: -   BLOOD ADMINISTERED:none  DRAINS: none   LOCAL MEDICATIONS USED:  BUPIVICAINE   SPECIMEN:  Excision  DISPOSITION OF SPECIMEN:  PATHOLOGY  COUNTS:  YES  TOURNIQUET:   Total Tourniquet Time Documented: Forearm (Right) - 21 minutes Total: Forearm (Right) - 21 minutes   DICTATION: .Other Dictation: Dictation Number [id number]  PLAN OF CARE: Discharge to home after PACU  PATIENT DISPOSITION:  PACU - hemodynamically stable.

## 2013-10-25 NOTE — Op Note (Deleted)
NAMDelrae Rend:  Koeneman, Rane                ACCOUNT NO.:  [id number]  MEDICAL RECORD NO.:  [id number]  LOCATION:                               FACILITY:  [facility]  PHYSICIAN:  Cindee SaltGary Miamor Ayler, M.D.       DATE OF BIRTH:  [birth year]  DATE OF PROCEDURE:  10/22/2013 DATE OF DISCHARGE:  10/22/2013                              OPERATIVE REPORT   PREOPERATIVE DIAGNOSIS:  Mucoid tumor, right small finger.  POSTOPERATIVE DIAGNOSIS:  Mucoid tumor, right small finger.  OPERATION:  Excision mucoid cyst, debridement distal interphalangeal joint, right small finger.  SURGEON:  Cindee SaltGary Mayer Vondrak, MD  ANESTHESIA:  Forearm based IV regional with metacarpal block.  HISTORY:  The patient is a 63 year old female with a history of a mass over the dorsal aspect of her right small finger.  This is grooving the nail distally and has been present for 6 months.  X-rays reveal minimal degenerative changes of the distal interphalangeal joint.  She has elected to have this surgically excised.  Pre, peri, and postoperative course have been discussed along with risks and complications.  She is aware that there is no guarantee with surgery, possibility of infection, recurrence of injury to arteries, nerves, tendons, incomplete relief of symptoms and dystrophy.  In the preoperative area, the patient is seen, the extremity marked by both patient and surgeon.  Antibiotic given.  PROCEDURE IN DETAIL:  The patient was brought to the operating room, where forearm-based IV regional anesthetic was carried out without difficulty.  She was prepped using ChloraPrep, supine position, right arm free.  A 3-minute dry time was allowed.  Time-out taken confirming the patient and procedure.  Curvilinear incision was made over the distal interphalangeal joint of the right small finger and carried down through subcutaneous tissue.  Bleeders were electrocauterized with bipolar.  Beneath the skin, a tunnel was made distally allowing the cyst to be  excised with a House curette and small hemostatic rongeur.  The joint was opened.  A complete synovectomy performed and debridement performed.  No further lesions were identified.  Specimen was sent to Pathology.  The wound was irrigated with saline.  The skin was then closed with interrupted 4-0 Vicryl Rapide sutures.  A metacarpal block was given at the beginning of the procedure with 0.25% Marcaine without epinephrine, approximately 7 mL was used.  A sterile compressive dressing and splint to the finger was applied.  On deflation of the tourniquet, remaining fingers pinked.  She was taken to the recovery room for observation in satisfactory condition.  She will be discharged home to [facility] in 1 week on Vicodin.          ______________________________ Cindee SaltGary Donna Snooks, M.D.     GK/MEDQ  D:  10/22/2013  T:  10/23/2013  Job:  [id number]

## 2013-10-25 NOTE — Op Note (Signed)
NAME:  Sharon Combs, Sharon Combs                ACCOUNT NO.:  [id number]  MEDICAL RECORD NO.:  [id number]  LOCATION:                               FACILITY:  [facility]  PHYSICIAN:  Gedalya Jim, M.D.       DATE OF BIRTH:  12/25/1950  DATE OF PROCEDURE:  10/22/2013 DATE OF DISCHARGE:  10/22/2013                              OPERATIVE REPORT   PREOPERATIVE DIAGNOSIS:  Mucoid tumor, right small finger.  POSTOPERATIVE DIAGNOSIS:  Mucoid tumor, right small finger.  OPERATION:  Excision mucoid cyst, debridement distal interphalangeal joint, right small finger.  SURGEON:  Masa Lubin, MD  ANESTHESIA:  Forearm based IV regional with metacarpal block.  HISTORY:  The patient is a 63-year-old female with a history of a mass over the dorsal aspect of her right small finger.  This is grooving the nail distally and has been present for 6 months.  X-rays reveal minimal degenerative changes of the distal interphalangeal joint.  She has elected to have this surgically excised.  Pre, peri, and postoperative course have been discussed along with risks and complications.  She is aware that there is no guarantee with surgery, possibility of infection, recurrence of injury to arteries, nerves, tendons, incomplete relief of symptoms and dystrophy.  In the preoperative area, the patient is seen, the extremity marked by both patient and surgeon.  Antibiotic given.  PROCEDURE IN DETAIL:  The patient was brought to the operating room, where forearm-based IV regional anesthetic was carried out without difficulty.  She was prepped using ChloraPrep, supine position, right arm free.  A 3-minute dry time was allowed.  Time-out taken confirming the patient and procedure.  Curvilinear incision was made over the distal interphalangeal joint of the right small finger and carried down through subcutaneous tissue.  Bleeders were electrocauterized with bipolar.  Beneath the skin, a tunnel was made distally allowing the cyst to be  excised with a House curette and small hemostatic rongeur.  The joint was opened.  A complete synovectomy performed and debridement performed.  No further lesions were identified.  Specimen was sent to Pathology.  The wound was irrigated with saline.  The skin was then closed with interrupted 4-0 Vicryl Rapide sutures.  A metacarpal block was given at the beginning of the procedure with 0.25% Marcaine without epinephrine, approximately 7 mL was used.  A sterile compressive dressing and splint to the finger was applied.  On deflation of the tourniquet, remaining fingers pinked.  She was taken to the recovery room for observation in satisfactory condition.  She will be discharged home to return to the [facility] in 1 week on Vicodin.          ______________________________ Hildegarde Dunaway, M.D.     GK/MEDQ  D:  10/22/2013  T:  10/23/2013  Job:  [id number] 

## 2013-10-27 ENCOUNTER — Encounter (HOSPITAL_BASED_OUTPATIENT_CLINIC_OR_DEPARTMENT_OTHER): Payer: Self-pay | Admitting: Orthopedic Surgery

## 2014-01-26 ENCOUNTER — Other Ambulatory Visit: Payer: Self-pay | Admitting: Cardiology

## 2014-09-26 ENCOUNTER — Other Ambulatory Visit: Payer: Self-pay

## 2014-09-26 ENCOUNTER — Telehealth: Payer: Self-pay | Admitting: Cardiology

## 2014-09-26 MED ORDER — POTASSIUM CHLORIDE CRYS ER 10 MEQ PO TBCR: 10.0000 meq | EXTENDED_RELEASE_TABLET | Freq: Once | ORAL | Status: AC

## 2014-09-26 NOTE — Telephone Encounter (Signed)
°  1. Which medications need to be refilled?Klor-Con 2. Which pharmacy is medication [phone]  3. Do they need a 30 day or 90 day supply? 30 and refills  4. Would they like a call back once the medication has been sent to the pharmacy? no

## 2014-11-03 ENCOUNTER — Telehealth: Payer: Self-pay

## 2014-11-03 NOTE — Telephone Encounter (Signed)
[facility] [place] [facility] [place]. She is checking on a prior auth for patient's Benicar/Hct. Advised her we did not know she needed a PA. Will do it now, via Cover My Meds. Let them know when we do.

## 2014-11-10 ENCOUNTER — Telehealth: Payer: Self-pay

## 2014-11-10 NOTE — Telephone Encounter (Signed)
Spoke with pharm Rep at [place]. She states Benicar-Hct 20-12.5mg  is not a covered drug under her plan. Patient informed via voicemail.

## 2014-11-11 ENCOUNTER — Telehealth: Payer: Self-pay

## 2014-11-11 NOTE — Telephone Encounter (Signed)
Patient called insurance will no longer cover Benicar/Hct.Advised to call PCP since he manages your B/P.

## 2015-07-26 ENCOUNTER — Other Ambulatory Visit: Payer: Self-pay | Admitting: Physician Assistant

## 2015-07-26 DIAGNOSIS — N644 Mastodynia: Secondary | ICD-10-CM

## 2015-07-31 ENCOUNTER — Ambulatory Visit
Admission: RE | Admit: 2015-07-31 | Discharge: 2015-07-31 | Disposition: A | Discharge status: Home / Self Care | Payer: Managed Care, Other (non HMO) | Source: Ambulatory Visit | Attending: Physician Assistant | Admitting: Physician Assistant

## 2015-07-31 DIAGNOSIS — N644 Mastodynia: Secondary | ICD-10-CM

## 2019-09-17 ENCOUNTER — Other Ambulatory Visit: Payer: Self-pay | Admitting: Nurse Practitioner

## 2019-09-17 DIAGNOSIS — Z1231 Encounter for screening mammogram for malignant neoplasm of breast: Secondary | ICD-10-CM

## 2020-05-19 ENCOUNTER — Other Ambulatory Visit: Payer: Self-pay | Admitting: Nurse Practitioner

## 2020-05-19 DIAGNOSIS — N644 Mastodynia: Secondary | ICD-10-CM

## 2020-06-29 ENCOUNTER — Other Ambulatory Visit: Payer: Managed Care, Other (non HMO)

## 2020-07-12 ENCOUNTER — Ambulatory Visit
Admission: RE | Admit: 2020-07-12 | Discharge: 2020-07-12 | Disposition: A | Discharge status: Home / Self Care | Payer: Medicare Other | Source: Ambulatory Visit | Attending: Nurse Practitioner | Admitting: Nurse Practitioner

## 2020-07-12 ENCOUNTER — Ambulatory Visit: Admission: RE | Admit: 2020-07-12 | Payer: Managed Care, Other (non HMO) | Source: Ambulatory Visit

## 2020-07-12 ENCOUNTER — Other Ambulatory Visit: Payer: Self-pay

## 2020-07-12 DIAGNOSIS — N644 Mastodynia: Secondary | ICD-10-CM | POA: Diagnosis not present

## 2020-08-08 DIAGNOSIS — R300 Dysuria: Secondary | ICD-10-CM | POA: Diagnosis not present

## 2020-08-08 DIAGNOSIS — N959 Unspecified menopausal and perimenopausal disorder: Secondary | ICD-10-CM | POA: Diagnosis not present

## 2020-08-08 DIAGNOSIS — N390 Urinary tract infection, site not specified: Secondary | ICD-10-CM | POA: Diagnosis not present

## 2020-09-26 DIAGNOSIS — I10 Essential (primary) hypertension: Secondary | ICD-10-CM | POA: Diagnosis not present

## 2020-09-26 DIAGNOSIS — M1990 Unspecified osteoarthritis, unspecified site: Secondary | ICD-10-CM | POA: Diagnosis not present

## 2020-09-26 DIAGNOSIS — F411 Generalized anxiety disorder: Secondary | ICD-10-CM | POA: Diagnosis not present

## 2020-09-26 DIAGNOSIS — G4700 Insomnia, unspecified: Secondary | ICD-10-CM | POA: Diagnosis not present

## 2021-02-22 DIAGNOSIS — Z23 Encounter for immunization: Secondary | ICD-10-CM | POA: Diagnosis not present

## 2021-04-03 DIAGNOSIS — I10 Essential (primary) hypertension: Secondary | ICD-10-CM | POA: Diagnosis not present

## 2021-04-03 DIAGNOSIS — Z136 Encounter for screening for cardiovascular disorders: Secondary | ICD-10-CM | POA: Diagnosis not present

## 2021-04-03 DIAGNOSIS — M1990 Unspecified osteoarthritis, unspecified site: Secondary | ICD-10-CM | POA: Diagnosis not present

## 2021-04-03 DIAGNOSIS — Z0000 Encounter for general adult medical examination without abnormal findings: Secondary | ICD-10-CM | POA: Diagnosis not present

## 2021-04-11 ENCOUNTER — Other Ambulatory Visit: Payer: Self-pay | Admitting: Nurse Practitioner

## 2021-04-11 DIAGNOSIS — M810 Age-related osteoporosis without current pathological fracture: Secondary | ICD-10-CM

## 2021-04-23 ENCOUNTER — Ambulatory Visit
Admission: RE | Admit: 2021-04-23 | Discharge: 2021-04-23 | Disposition: A | Discharge status: Home / Self Care | Payer: Medicare Other | Source: Ambulatory Visit | Attending: Nurse Practitioner | Admitting: Nurse Practitioner

## 2021-04-23 ENCOUNTER — Other Ambulatory Visit: Payer: Self-pay

## 2021-04-23 DIAGNOSIS — Z780 Asymptomatic menopausal state: Secondary | ICD-10-CM | POA: Diagnosis not present

## 2021-04-23 DIAGNOSIS — M85852 Other specified disorders of bone density and structure, left thigh: Secondary | ICD-10-CM | POA: Diagnosis not present

## 2021-04-23 DIAGNOSIS — M810 Age-related osteoporosis without current pathological fracture: Secondary | ICD-10-CM

## 2021-08-31 DIAGNOSIS — R079 Chest pain, unspecified: Secondary | ICD-10-CM | POA: Diagnosis not present

## 2021-08-31 DIAGNOSIS — R300 Dysuria: Secondary | ICD-10-CM | POA: Diagnosis not present

## 2021-08-31 DIAGNOSIS — M1990 Unspecified osteoarthritis, unspecified site: Secondary | ICD-10-CM | POA: Diagnosis not present

## 2021-09-13 NOTE — Progress Notes (Signed)
?  ?Cardiology Office Note ? ? ?Date:  09/19/2021  ? ?ID:  Sharon Combs, DOB Apr 15, 1951, MRN [id number] ? ?PCP:  Joycelyn Rua, MD  ?Cardiologist:   Dietrich Samuelson [place], MD  ? ?Chief Complaint  ?Patient presents with  ? Chest Pain  ? ? ?  ?History of Present Illness: ?Sharon Combs is a 71 y.o. female who is seen at the request of Mitzi Hansen NP for evaluation of chest pain. She has a history of HTN. She was last seen by me in 2015. In June 2014 she had an episode of syncope.  She was not significantly orthostatic. Subsequent evaluation with an echocardiogram was normal. She also wore an event monitor which was normal.  ? ?More recently over the past month she has noted pain in her left neck radiating down her left arm. This can occur anytime and may be continuous. Her husband did recently undergo radioactive seed implant for prostate CA so she has been under stress. She also has significant arthritis including the cervical spine. She does note some SOB when carrying things up stairs. Her Ecg has been abnormal.  ? ? ? ?Past Medical History:  ?Diagnosis Date  ? Cat scratch fever   ? as a child  ? Colon polyps   ? DJD (degenerative joint disease)   ? cervical spine  ? GERD (gastroesophageal reflux disease)   ? History of echocardiogram   ? Echo 7/14: EF 60-65%, normal wall motion  ? Hypertension   ? renal art dopplers 04/2007: normal  ? Panic attack   ? PONV (postoperative nausea and vomiting)   ? hard to wake up  ? Syncope 6/14  ? ? ?Past Surgical History:  ?Procedure Laterality Date  ? BREAST EXCISIONAL BIOPSY Left 1973  ? BREAST EXCISIONAL BIOPSY Right   ? COLON SURGERY  1984  ? colon resection  ? COLONOSCOPY    ? DILATION AND CURETTAGE OF UTERUS    ? GANGLION CYST EXCISION    ? both wrists  ? MASS EXCISION Right 10/22/2013  ? Procedure: EXCISION MUCOID TUMOR RIGHT SMALL FINGER;  Surgeon: Nicki Reaper, MD;  Location: [facility];  Service: Orthopedics;  Laterality: Right;  ANESTHESIA:  IV REGIONAL FAB  ?  total hysterectomy - unknown type    ? ? ? ?Current Outpatient Medications  ?Medication Sig Dispense Refill  ? ACETAMINOPHEN PO Take by mouth as directed.    ? ALPRAZolam (XANAX) 0.25 MG tablet Take 0.25 mg by mouth at bedtime as needed.    ? amitriptyline (ELAVIL) 10 MG tablet Take 10 mg by mouth at bedtime.    ? lidocaine (LIDODERM) 5 % 1 PATCH TO SKIN REMOVE AFTER 12 HOURS ONCE A DAY EXTERNALLY 30 DAYS    ? meclizine (ANTIVERT) 25 MG tablet Take 25 mg by mouth daily as needed.    ? Multiple Vitamin (MULTIVITAMIN) tablet Take 1 tablet by mouth daily.    ? valsartan-hydrochlorothiazide (DIOVAN-HCT) 160-12.5 MG tablet Take 1 tablet by mouth daily.    ? venlafaxine XR (EFFEXOR-XR) 37.5 MG 24 hr capsule Take 37.5 mg by mouth daily.    ? BENICAR HCT 20-12.5 MG per tablet TAKE 1 TABLET BY MOUTH DAILY. (Patient not taking: Reported on 09/19/2021) 90 tablet 3  ? cyclobenzaprine (FLEXERIL) 10 MG tablet Take 10 mg by mouth as needed for muscle spasms. (Patient not taking: Reported on 09/19/2021)    ? estrogens, conjugated, (PREMARIN) 0.3 MG tablet Take 0.3 mg by mouth daily. Take  daily for 21 days then do not take for 7 days. (Patient not taking: Reported on 09/19/2021)    ? HYDROcodone-acetaminophen (NORCO) 5-325 MG per tablet Take 1 tablet by mouth every 6 (six) hours as needed for moderate pain. (Patient not taking: Reported on 09/19/2021) 30 tablet 0  ? KLOR-CON 10 10 MEQ tablet TAKE 1 TABLET EVERY DAY (Patient not taking: Reported on 09/19/2021) 30 tablet 11  ? Misc Natural Products (OSTEO BI-FLEX JOINT SHIELD PO) Take by mouth daily. (Patient not taking: Reported on 09/19/2021)    ? potassium chloride (K-DUR,KLOR-CON) 10 MEQ tablet Take 1 tablet (10 mEq total) by mouth once. (Patient not taking: Reported on 09/19/2021) 90 tablet 3  ? ranitidine (ZANTAC) 150 MG capsule Take 150 mg by mouth 2 (two) times daily. (Patient not taking: Reported on 09/19/2021)    ? ?No current facility-administered medications for this visit.   ? ? ?Allergies:   Chlorphen-pe-acetaminophen, Pseudoephedrine, Pseudoephedrine-guaifenesin, Entex t [pseudoephedrine-guaifenesin], and Pseudoephedrine hcl  ? ? ?Social History:  The patient  reports that she has never smoked. She does not have any smokeless tobacco history on file. She reports that she does not drink alcohol.  ? ?Family History:  The patient's family history includes Arrhythmia in her mother; Breast cancer in her maternal grandmother; Heart disease in her father; Hypertension in an other family member; Pancreatic cancer in an other family member; Pneumonia in an other family member.  ? ? ?ROS:  Please see the history of present illness.   Otherwise, review of systems are positive for none.   All other systems are reviewed and negative.  ? ? ?PHYSICAL EXAM: ?VS:  BP 130/70 (BP Location: Left Arm)   Pulse 96   Ht 5\' 5"  (1.651 m)   Wt 210 lb 3.2 oz (95.3 kg)   SpO2 98%   BMI 34.98 kg/m?  , BMI Body mass index is 34.98 kg/m?. ?GEN: Well nourished, overweight, in no acute distress ?HEENT: normal ?Neck: no JVD, carotid bruits, or masses ?Cardiac: RRR; no murmurs, rubs, or gallops,no edema  ?Respiratory:  clear to auscultation bilaterally, normal work of breathing ?GI: soft, nontender, nondistended, + BS ?MS: no deformity or atrophy ?Skin: warm and dry, no rash ?Neuro:  Strength and sensation are intact ?Psych: euthymic mood, full affect ? ? ?EKG:  EKG is ordered today. ?The ekg ordered today demonstrates NSR rate 96. LAD. Very poor R wave progression across the entire precordium- possible anterolateral infarct age undetermined. I have personally reviewed and interpreted this study. ? ? ? ?Recent Labs: ?No results found for requested labs within last 8760 hours.  ? ?Dtaed 04/03/21: cholesterol 190, triglycerides 198, HDL 54, LDL 102. CMET normal. ? ?Lipid Panel ?No results found for: CHOL, TRIG, HDL, CHOLHDL, VLDL, LDLCALC, LDLDIRECT ?  ? ?Wt Readings from Last 3 Encounters:  ?09/19/21 210 lb 3.2 oz  (95.3 kg)  ?10/22/13 200 lb (90.7 kg)  ?08/27/13 201 lb 12.8 oz (91.5 kg)  ?  ? ? ?Other studies Reviewed: ?Additional studies/ records that were reviewed today include:  ? ?Echo 12/09/12: Study Conclusions  ? ?Left ventricle: The cavity size was normal. Wall thickness  ?was normal. Systolic function was normal. The estimated  ?ejection fraction was in the range of 60% to 65%. Wall  ?motion was normal; there were no regional wall motion  ?abnormalities.    ? ?ASSESSMENT AND PLAN: ? ?1.  Atypical chest pain, left neck and arm pain. Patient at intermediate risk. Abnormal Ecg. Recommend ischemic evaluation. Discussed  pharmacologic Nuclear stress testing versus Coronary CTA. I think CTA is most appropriate. Discussed need for contrast and beta blockade. Patient is agreeable  ?2. HTN ?3. Mixed dyslipidemia.  ?4. Osteoarthritis.  ? ? ?Current medicines are reviewed at length with the patient today.  The patient does not have concerns regarding medicines. ? ?The following changes have been made:  no change ? ?Labs/ tests ordered today include:  ?Coronary CTA ? ? ?   ? ? ?Disposition:   FU with me after above. ? ?Signed, ?Jeff Frieden [place], MD  ?09/19/2021 4:45 PM    ?[facility] ?[address], [place], [place], [address] ?Phone [phone], Fax [phone] ? ? ?

## 2021-09-19 ENCOUNTER — Encounter: Payer: Self-pay | Admitting: Cardiology

## 2021-09-19 ENCOUNTER — Ambulatory Visit: Payer: Medicare Other | Admitting: Cardiology

## 2021-09-19 VITALS — BP 130/70 | HR 96 | Ht 65.0 in | Wt 210.2 lb

## 2021-09-19 DIAGNOSIS — E782 Mixed hyperlipidemia: Secondary | ICD-10-CM

## 2021-09-19 DIAGNOSIS — I10 Essential (primary) hypertension: Secondary | ICD-10-CM | POA: Diagnosis not present

## 2021-09-19 DIAGNOSIS — R072 Precordial pain: Secondary | ICD-10-CM

## 2021-09-19 DIAGNOSIS — R0789 Other chest pain: Secondary | ICD-10-CM | POA: Diagnosis not present

## 2021-09-19 MED ORDER — METOPROLOL TARTRATE 100 MG PO TABS: ORAL_TABLET | ORAL | 0 refills | Status: AC

## 2021-09-19 NOTE — Patient Instructions (Addendum)
Medication Instructions:  ?Continue same medications ? ? ?Lab Work: ?Bmet  ? ? ?Testing/Procedures: ?Coronary CT  will be scheduled after approved by insurance   Follow instructions below ? ? ?Follow-Up: ?At [facility], you and your health needs are our priority.  As part of our continuing mission to provide you with exceptional heart care, we have created designated Provider Care Teams.  These Care Teams include your primary Cardiologist (physician) and Advanced Practice Providers (APPs -  Physician Assistants and Nurse Practitioners) who all work together to provide you with the care you need, when you need it. ? ?We recommend signing up for the patient portal called "MyChart".  Sign up information is provided on this After Visit Summary.  MyChart is used to connect with patients for Virtual Visits (Telemedicine).  Patients are able to view lab/test results, encounter notes, upcoming appointments, etc.  Non-urgent messages can be sent to your provider as well.   ?To learn more about what you can do with MyChart, go to [email or web address].   ? ?Your next appointment:   ?  ? ?The format for your next appointment: Office  ? ? ?Provider:  Dr.Jordan ? ? ? ? ?Your cardiac CT will be scheduled at one of the below locations:  ? ?[facility] ?[address] ?[place], [place] [address] ?[phone] ? ?OR ? ?[place] [address] ?[address] ?[place], [place] [address] ?[phone] ? ?If scheduled at [facility], please arrive at the [facility] and Children's Entrance (Entrance C2) of [facility] 30 minutes prior to test start time. ?You can use the FREE valet parking offered at entrance C (encouraged to control the heart rate for the test)  ?Proceed to the [facility] Radiology Department (first floor) to check-in and test prep. ? ?All radiology patients and guests should use entrance C2 at [facility], accessed from [facility],  even though the hospital's physical address listed is [address] ? ? ? ?If scheduled at [facility], please arrive 15 mins early for check-in and test prep. ? ?Please follow these instructions carefully (unless otherwise directed): ? ? ?On the Night Before the Test: ?Be sure to Drink plenty of water. ?Do not consume any caffeinated/decaffeinated beverages or chocolate 12 hours prior to your test. ?Do not take any antihistamines 12 hours prior to your test. ? ? ?On the Day of the Test: ?Drink plenty of water until 1 hour prior to the test. ?Do not eat any food 4 hours prior to the test. ?You may take your regular medications prior to the test.  ?Take metoprolol 100 mg two hours prior to test. ?HOLD Valsartan/HCTZ and Benicar/HCTZ morning of test ?FEMALES- please wear underwire-free bra if available, avoid dresses & tight clothing ? ? ?     ?After the Test: ?Drink plenty of water. ?After receiving IV contrast, you may experience a mild flushed feeling. This is normal. ?On occasion, you may experience a mild rash up to 24 hours after the test. This is not dangerous. If this occurs, you can take Benadryl 25 mg and increase your fluid intake. ?If you experience trouble breathing, this can be serious. If it is severe call 911 IMMEDIATELY. If it is mild, please call our office. ? ?We will call to schedule your test 2-4 weeks out understanding that some insurance companies will need an authorization prior to the service being performed.  ? ?For non-scheduling related questions, please contact the  cardiac imaging nurse navigator should you have any questions/concerns: ?Marchia Bond, Cardiac Imaging Nurse Navigator ?Gordy Clement, Cardiac Imaging Nurse Navigator ?[facility] ?Direct Office Dial: [phone]  ? ?For scheduling needs, including cancellations and rescheduling, please call [place], [phone]. ? ? ? ?Important Information About  S[employer] ? ? ? ? ? ? ?

## 2021-09-20 ENCOUNTER — Other Ambulatory Visit: Payer: Self-pay | Admitting: Cardiology

## 2021-09-21 DIAGNOSIS — R0789 Other chest pain: Secondary | ICD-10-CM | POA: Diagnosis not present

## 2021-09-21 DIAGNOSIS — I10 Essential (primary) hypertension: Secondary | ICD-10-CM | POA: Diagnosis not present

## 2021-09-21 DIAGNOSIS — R072 Precordial pain: Secondary | ICD-10-CM | POA: Diagnosis not present

## 2021-09-21 DIAGNOSIS — E782 Mixed hyperlipidemia: Secondary | ICD-10-CM | POA: Diagnosis not present

## 2021-09-21 LAB — BASIC METABOLIC PANEL
BUN/Creatinine Ratio: 12 (ref 12–28)
BUN: 12 mg/dL (ref 8–27)
CO2: 27 mmol/L (ref 20–29)
Calcium: 9.6 mg/dL (ref 8.7–10.3)
Chloride: 100 mmol/L (ref 96–106)
Creatinine, Ser: 1.04 mg/dL — ABNORMAL HIGH (ref 0.57–1.00)
Glucose: 94 mg/dL (ref 70–99)
Potassium: 3.9 mmol/L (ref 3.5–5.2)
Sodium: 140 mmol/L (ref 134–144)
eGFR: 57 mL/min/{1.73_m2} — ABNORMAL LOW (ref >59)

## 2021-10-02 ENCOUNTER — Telehealth (HOSPITAL_COMMUNITY): Payer: Self-pay | Admitting: *Deleted

## 2021-10-02 NOTE — Telephone Encounter (Signed)
Reaching out to patient to offer assistance regarding upcoming cardiac imaging study; pt verbalizes understanding of appt date/time, parking situation and where to check in, pre-test NPO status and medications ordered, and verified current allergies; name and call back number provided for further questions should they arise ? ?Gordy Clement RN Navigator Cardiac Imaging ?[facility] ?[phone] office ?[phone] cell ? ?Patient to take 100mg  metoprolol tartrate two hours prior to her cardiac CT scan. She reports some claustrophobia in a CT scanner and was encouraged to take Xanax only if she has a ride. She states her husband will be escorting her to the appointment.  She is aware to arrive at 8am. ?

## 2021-10-03 ENCOUNTER — Ambulatory Visit (HOSPITAL_COMMUNITY)
Admission: RE | Admit: 2021-10-03 | Discharge: 2021-10-03 | Disposition: A | Discharge status: Home / Self Care | Payer: Medicare Other | Source: Ambulatory Visit | Attending: Cardiology | Admitting: Cardiology

## 2021-10-03 DIAGNOSIS — R0789 Other chest pain: Secondary | ICD-10-CM | POA: Diagnosis not present

## 2021-10-03 DIAGNOSIS — E782 Mixed hyperlipidemia: Secondary | ICD-10-CM | POA: Insufficient documentation

## 2021-10-03 DIAGNOSIS — R072 Precordial pain: Secondary | ICD-10-CM | POA: Diagnosis not present

## 2021-10-03 DIAGNOSIS — I10 Essential (primary) hypertension: Secondary | ICD-10-CM | POA: Insufficient documentation

## 2021-10-03 MED ORDER — NITROGLYCERIN 0.4 MG SL SUBL
0.8000 mg | SUBLINGUAL_TABLET | Freq: Once | SUBLINGUAL | Status: AC
  Administered 2021-10-03: 0.8 mg via SUBLINGUAL

## 2021-10-03 MED ORDER — METOPROLOL TARTRATE 5 MG/5ML IV SOLN
INTRAVENOUS | Status: AC
  Filled 2021-10-03: qty 10

## 2021-10-03 MED ORDER — NITROGLYCERIN 0.4 MG SL SUBL
SUBLINGUAL_TABLET | SUBLINGUAL | Status: AC
  Filled 2021-10-03: qty 2

## 2021-10-03 MED ORDER — NITROGLYCERIN 0.4 MG SL SUBL
0.8000 mg | SUBLINGUAL_TABLET | Freq: Once | SUBLINGUAL | Status: AC
Start: 2021-10-03 — End: 2021-10-03
  Administered 2021-10-03: 0.8 mg via SUBLINGUAL

## 2021-10-03 MED ORDER — IOHEXOL 350 MG/ML SOLN
100.0000 mL | Freq: Once | INTRAVENOUS | Status: AC | PRN
  Administered 2021-10-03: 100 mL via INTRAVENOUS

## 2021-10-03 NOTE — Progress Notes (Signed)
?  Evaluation after Contrast Extravasation ? ?Patient seen and examined immediately after contrast extravasation while in CT. ? ?Exam: ?There is mild swelling at the left [facility] area.  ?There is no erythema. ?There is some discoloration. ?There are no blisters. ?There are no signs of decreased perfusion of the skin.  ?It is not warm to touch.  ?The patient has full ROM in fingers.  ?Radial pulse is normal. ? ?Per contrast extravasation protocol, I have instructed the patient to keep an ice pack on the area for 20 minutes at a time for about 48 hours. ?  ?Keep arm elevated as much as possible. ?  ?The patient understands to call the radiology department if there is: ?- increase in pain or swelling ?- changed or altered sensation ?- ulceration or blistering ?- increasing redness ?- warmth or increasing firmness ?- decreased tissue perfusion as noted by decreased capillary refill or discoloration of skin ?- decreased pulses peripheral to site ? ? ?Rei Medlen PA-C ?10/03/2021 ?10:27 AM ? ? ?  ?

## 2021-10-03 NOTE — Progress Notes (Signed)
IV contrast for CT heart extravasated during infusion. IV d/c, was examined by Lincoln Maxin PA and given instructions re post contrast extravasation including when to call her Dr and when to return to ED for assessment. Given an ice pack to site after IV removed. Aware to use ice pack for 20 minutes every hour. Site is puffy and bruised and patient states feels tight. ?IV inserted per [facility] CT tech, using ultrasound. VS stable and 2nd dose of nitro given to repeat CT scan. Patient felt fine after scan. Aware to drink plenty of fluids today. VS stable post scan. Discharged walking with husband. ?

## 2021-11-09 ENCOUNTER — Ambulatory Visit: Payer: Medicare Other | Admitting: Cardiology

## 2022-01-21 DIAGNOSIS — M654 Radial styloid tenosynovitis [de Quervain]: Secondary | ICD-10-CM | POA: Diagnosis not present

## 2022-01-21 DIAGNOSIS — M1990 Unspecified osteoarthritis, unspecified site: Secondary | ICD-10-CM | POA: Diagnosis not present

## 2022-04-11 DIAGNOSIS — I10 Essential (primary) hypertension: Secondary | ICD-10-CM | POA: Diagnosis not present

## 2022-04-11 DIAGNOSIS — M8588 Other specified disorders of bone density and structure, other site: Secondary | ICD-10-CM | POA: Diagnosis not present

## 2022-04-11 DIAGNOSIS — M1990 Unspecified osteoarthritis, unspecified site: Secondary | ICD-10-CM | POA: Diagnosis not present

## 2022-04-11 DIAGNOSIS — Z0000 Encounter for general adult medical examination without abnormal findings: Secondary | ICD-10-CM | POA: Diagnosis not present

## 2022-04-12 DIAGNOSIS — R7301 Impaired fasting glucose: Secondary | ICD-10-CM | POA: Diagnosis not present

## 2022-08-15 DIAGNOSIS — H5213 Myopia, bilateral: Secondary | ICD-10-CM | POA: Diagnosis not present

## 2022-10-31 ENCOUNTER — Encounter: Payer: Self-pay | Admitting: Family Medicine

## 2022-10-31 ENCOUNTER — Ambulatory Visit (INDEPENDENT_AMBULATORY_CARE_PROVIDER_SITE_OTHER): Payer: Medicare Other

## 2022-10-31 ENCOUNTER — Other Ambulatory Visit: Payer: Self-pay | Admitting: Family Medicine

## 2022-10-31 DIAGNOSIS — M25551 Pain in right hip: Secondary | ICD-10-CM

## 2022-10-31 DIAGNOSIS — M160 Bilateral primary osteoarthritis of hip: Secondary | ICD-10-CM | POA: Diagnosis not present

## 2022-10-31 DIAGNOSIS — B351 Tinea unguium: Secondary | ICD-10-CM | POA: Diagnosis not present

## 2022-10-31 DIAGNOSIS — M25552 Pain in left hip: Secondary | ICD-10-CM | POA: Diagnosis not present

## 2022-10-31 DIAGNOSIS — Z0000 Encounter for general adult medical examination without abnormal findings: Secondary | ICD-10-CM | POA: Diagnosis not present

## 2022-12-16 DIAGNOSIS — B351 Tinea unguium: Secondary | ICD-10-CM | POA: Diagnosis not present

## 2022-12-21 ENCOUNTER — Ambulatory Visit
Admission: EM | Admit: 2022-12-21 | Discharge: 2022-12-21 | Disposition: A | Discharge status: Home / Self Care | Payer: Medicare Other

## 2022-12-21 ENCOUNTER — Other Ambulatory Visit: Payer: Self-pay

## 2022-12-21 DIAGNOSIS — H6692 Otitis media, unspecified, left ear: Secondary | ICD-10-CM | POA: Diagnosis not present

## 2022-12-21 MED ORDER — AMOXICILLIN-POT CLAVULANATE 875-125 MG PO TABS: 1.0000 | ORAL_TABLET | Freq: Two times a day (BID) | ORAL | 0 refills | Status: AC

## 2022-12-21 NOTE — Discharge Instructions (Addendum)
Instructed patient to take medication as directed with food to completion.  Encouraged increase daily water intake to 64 ounces per day while taking this medication.  Advised patient not to submerge head underwater for the next 10 days.  Advised patient if symptoms worsen and/or unresolved please follow-up with PCP, ENT or here for further evaluation.

## 2022-12-21 NOTE — ED Provider Notes (Signed)
[facility]    CSN: [id number] Arrival date & time: 12/21/22  4742      History   Chief Complaint Chief Complaint  Patient presents with   Otalgia    HPI Sharon Combs is a 72 y.o. female.   HPI Pleasant 72 year old female presents with left ear pain for 2 weeks, reports worsening over the past 3 days.  Patient reports swelling to left ear as well.  PMH significant for obesity, HTN, and syncope.  Past Medical History:  Diagnosis Date   Cat scratch fever    as a child   Colon polyps    DJD (degenerative joint disease)    cervical spine   GERD (gastroesophageal reflux disease)    History of echocardiogram    Echo 7/14: EF 60-65%, normal wall motion   Hypertension    renal art dopplers 04/2007: normal   Panic attack    PONV (postoperative nausea and vomiting)    hard to wake up   Syncope 6/14    Patient Active Problem List   Diagnosis Date Noted   Syncope 12/02/2012   Essential hypertension, benign 12/02/2012   Obesity 12/02/2012    Past Surgical History:  Procedure Laterality Date   BREAST EXCISIONAL BIOPSY Left 1973   BREAST EXCISIONAL BIOPSY Right    COLON SURGERY  1984   colon resection   COLONOSCOPY     DILATION AND CURETTAGE OF UTERUS     GANGLION CYST EXCISION     both wrists   MASS EXCISION Right 10/22/2013   Procedure: EXCISION MUCOID TUMOR RIGHT SMALL FINGER;  Surgeon: Nicki Reaper, MD;  Location: [facility];  Service: Orthopedics;  Laterality: Right;  ANESTHESIA:  IV REGIONAL FAB   total hysterectomy - unknown type      OB History   No obstetric history on file.      Home Medications    Prior to Admission medications   Medication Sig Start Date End Date Taking? Authorizing Provider  acetaminophen-codeine (TYLENOL #3) 300-30 MG tablet Take 1 tablet by mouth every 6 (six) hours as needed. 08/28/22  Yes [provider]  amoxicillin-clavulanate (AUGMENTIN) 875-125 MG tablet Take 1 tablet by mouth 2 (two)  times daily for 10 days. 12/21/22 12/31/22 Yes Trevor Iha, FNP  meloxicam (MOBIC) 15 MG tablet Take 15 mg by mouth daily. 10/31/22  Yes [provider]  ACETAMINOPHEN PO Take by mouth as directed.    [provider]  ALPRAZolam Prudy Feeler) 0.25 MG tablet Take 0.25 mg by mouth at bedtime as needed.    [provider]  amitriptyline (ELAVIL) 10 MG tablet Take 10 mg by mouth at bedtime. 08/31/21   [provider]  BENICAR HCT 20-12.5 MG per tablet TAKE 1 TABLET BY MOUTH DAILY. Patient not taking: Reported on 09/19/2021 01/27/14   [place], Peter M, MD  cyclobenzaprine (FLEXERIL) 10 MG tablet Take 10 mg by mouth as needed for muscle spasms. Patient not taking: Reported on 09/19/2021    [provider]  estrogens, conjugated, (PREMARIN) 0.3 MG tablet Take 0.3 mg by mouth daily. Take daily for 21 days then do not take for 7 days. Patient not taking: Reported on 09/19/2021    [provider]  HYDROcodone-acetaminophen (NORCO) 5-325 MG per tablet Take 1 tablet by mouth every 6 (six) hours as needed for moderate pain. Patient not taking: Reported on 09/19/2021 10/22/13   Cindee Salt, MD  KLOR-CON 10 10 MEQ tablet TAKE 1 TABLET EVERY DAY  Patient not taking: Reported on 09/19/2021 09/24/13   [place], Peter M, MD  lidocaine (LIDODERM) 5 % 1 PATCH TO SKIN REMOVE AFTER 12 HOURS ONCE A DAY EXTERNALLY 30 DAYS 12/08/19   [provider]  meclizine (ANTIVERT) 25 MG tablet Take 25 mg by mouth daily as needed. 06/18/21   [provider]  metoprolol tartrate (LOPRESSOR) 100 MG tablet Take 100 mg 2 hours before Coronary CT 09/19/21   [place], Peter M, MD  Misc Natural Products (OSTEO BI-FLEX JOINT SHIELD PO) Take by mouth daily. Patient not taking: Reported on 09/19/2021    [provider]  Multiple Vitamin (MULTIVITAMIN) tablet Take 1 tablet by mouth daily.    [provider]  potassium chloride (K-DUR,KLOR-CON) 10 MEQ tablet Take 1 tablet (10 mEq  total) by mouth once. Patient not taking: Reported on 09/19/2021 09/26/14   [place], Peter M, MD  ranitidine (ZANTAC) 150 MG capsule Take 150 mg by mouth 2 (two) times daily. Patient not taking: Reported on 09/19/2021    [provider]  valsartan-hydrochlorothiazide (DIOVAN-HCT) 160-12.5 MG tablet Take 1 tablet by mouth daily. 09/02/21   [provider]  venlafaxine XR (EFFEXOR-XR) 37.5 MG 24 hr capsule Take 37.5 mg by mouth daily. 09/15/21   [provider]    Family History Family History  Problem Relation Age of Onset   Pancreatic cancer Mother 60   Arrhythmia Mother        "tachycardia"   Stroke Mother    Pneumonia Father 2   Heart disease Father    Non-Hodgkin's lymphoma Brother    Aortic aneurysm Brother    Throat cancer Brother    Myelodysplastic syndrome Brother    Heart attack Brother    Hypertension Brother    Breast cancer Maternal Grandmother    Pneumonia Other    Pancreatic cancer Other    Hypertension Other     Social History Social History   Tobacco Use   Smoking status: Never  Vaping Use   Vaping status: Never Used  Substance Use Topics   Alcohol use: No     Allergies   Chlorphen-pe-acetaminophen, Pseudoephedrine, Pseudoephedrine-guaifenesin, Entex t [pseudoephedrine-guaifenesin], and Pseudoephedrine hcl   Review of Systems Review of Systems  HENT:  Positive for ear pain.   All other systems reviewed and are negative.    Physical Exam Triage Vital Signs ED Triage Vitals [12/21/22 0831]  Encounter Vitals Group     BP      Systolic BP Percentile      Diastolic BP Percentile      Pulse      Resp      Temp      Temp src      SpO2      Weight      Height      Head Circumference      Peak Flow      Pain Score 2     Pain Loc      Pain Education      Exclude from Growth Chart    No data found.  Updated Vital Signs BP (!) 140/87   Pulse 98   Temp 98.1 F (36.7 C)   Resp 16   SpO2 98%       Physical  Exam Vitals and nursing note reviewed.  Constitutional:      Appearance: Normal appearance. She is obese.  HENT:     Head: Normocephalic and atraumatic.     Right Ear: Tympanic membrane, ear  canal and external ear normal.     Left Ear: External ear normal.     Ears:     Comments: Left EAC: mildly compressed; Left TM: red rimmed, retracted    Mouth/Throat:     Mouth: Mucous membranes are moist.     Pharynx: Oropharynx is clear.  Eyes:     Extraocular Movements: Extraocular movements intact.     Conjunctiva/sclera: Conjunctivae normal.     Pupils: Pupils are equal, round, and reactive to light.  Cardiovascular:     Rate and Rhythm: Normal rate and regular rhythm.     Pulses: Normal pulses.     Heart sounds: Normal heart sounds.  Pulmonary:     Effort: Pulmonary effort is normal.     Breath sounds: Normal breath sounds. No wheezing, rhonchi or rales.  Musculoskeletal:        General: Normal range of motion.     Cervical back: Normal range of motion and neck supple.  Skin:    General: Skin is warm and dry.  Neurological:     General: No focal deficit present.     Mental Status: She is alert and oriented to person, place, and time. Mental status is at baseline.  Psychiatric:        Mood and Affect: Mood normal.        Behavior: Behavior normal.      UC Treatments / Results  Labs (all labs ordered are listed, but only abnormal results are displayed) Labs Reviewed - No data to display  EKG   Radiology No results found.  Procedures Procedures (including critical care time)  Medications Ordered in UC Medications - No data to display  Initial Impression / Assessment and Plan / UC Course  I have reviewed the triage vital signs and the nursing notes.  Pertinent labs & imaging results that were available during my care of the patient were reviewed by me and considered in my medical decision making (see chart for details).     MDM: 1.  Acute left otitis media-Rx'd  Augmentin 875/125 mg tablet twice daily x 10 days. Instructed patient to take medication as directed with food to completion.  Encouraged increase daily water intake to 64 ounces per day while taking this medication.  Advised patient not to submerge head underwater for the next 10 days.  Advised patient if symptoms worsen and/or unresolved please follow-up with PCP, ENT or here for further evaluation.  Final Clinical Impressions(s) / UC Diagnoses   Final diagnoses:  Acute left otitis media     Discharge Instructions      Instructed patient to take medication as directed with food to completion.  Encouraged increase daily water intake to 64 ounces per day while taking this medication.  Advised patient not to submerge head underwater for the next 10 days.  Advised patient if symptoms worsen and/or unresolved please follow-up with PCP, ENT or here for further evaluation.     ED Prescriptions     Medication Sig Dispense Auth. Provider   amoxicillin-clavulanate (AUGMENTIN) 875-125 MG tablet Take 1 tablet by mouth 2 (two) times daily for 10 days. 20 tablet Trevor Iha, FNP      PDMP not reviewed this encounter.   Trevor Iha, FNP 12/21/22 [phone]

## 2022-12-21 NOTE — ED Triage Notes (Signed)
Pt presents to [facility] with co of left ear pain for 2 weeks worsening over the last few days. Pt reports swelling to the area as well.

## 2023-01-14 DIAGNOSIS — H9202 Otalgia, left ear: Secondary | ICD-10-CM | POA: Diagnosis not present

## 2023-08-12 IMAGING — CT CT HEART MORP W/ CTA COR W/ SCORE W/ CA W/CM &/OR W/O CM
3 of 8 series · 6 of 20 positions shown, 7 images · non-contrast
Comparison: None.
COMPARISON: None.

Addendum:
EXAM:
OVER-READ INTERPRETATION  CT CHEST

The following report is an over-read performed by radiologist Dr.
Xavier Dario Polania [REDACTED] on 10/03/2021. This
over-read does not include interpretation of cardiac or coronary
anatomy or pathology. The coronary calcium score/coronary CTA
interpretation by the cardiologist is attached.
CLINICAL DATA: 71 year old with chest pain
Cardiac/Coronary  CTA
TECHNIQUE: The patient was scanned on a [employer] Force scanner.

[Series 12: ts syst · axial · 0.39mm/px · z∈[+1485,+1525]mm · 2 of 299 slices shown, 3 images]
[im 100/299  vessel]
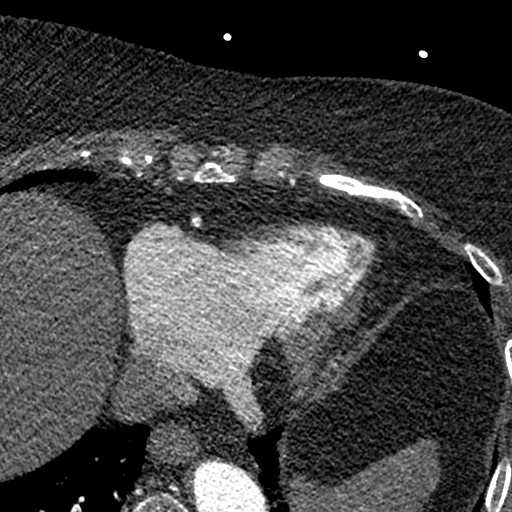
[im 100/299  lung]
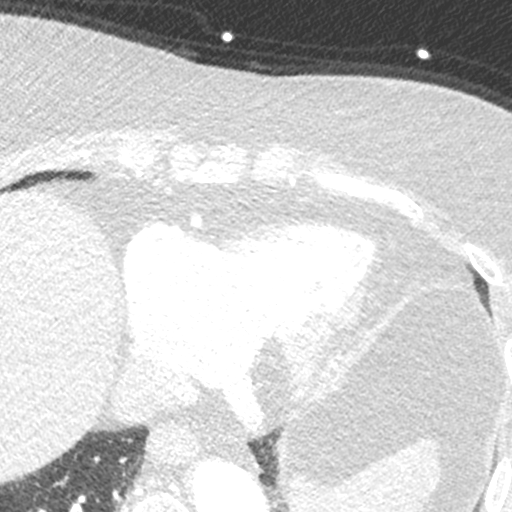
[im 199/299  vessel]
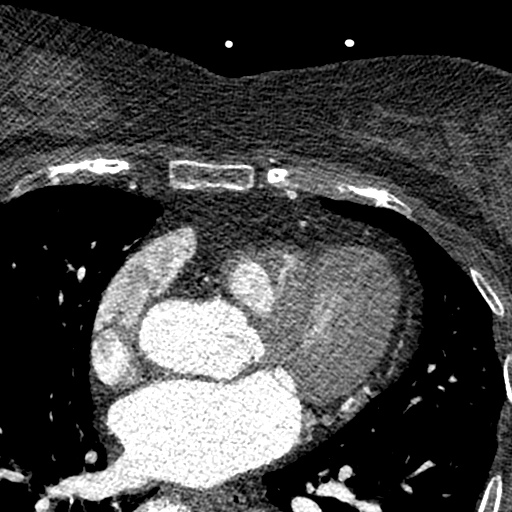

[Series 14: best syst · axial · 0.39mm/px · z∈[+1485,+1525]mm · 2 of 299 slices shown]
[im 100/299  vessel]
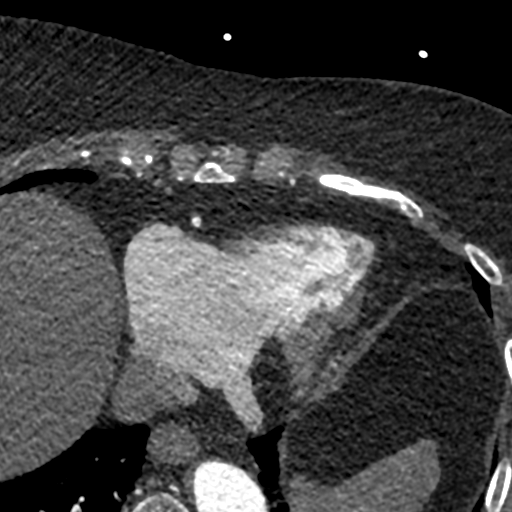
[im 199/299  vessel]
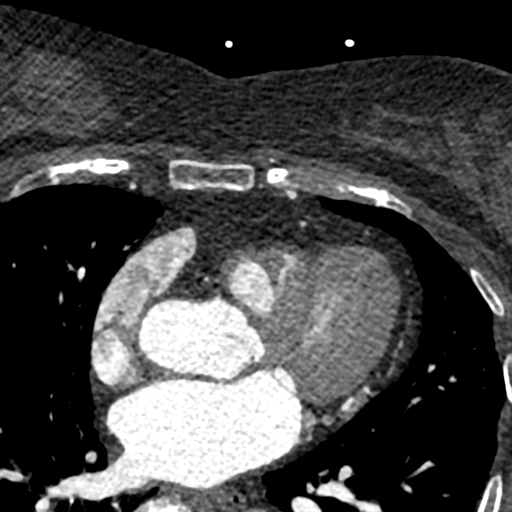

[Series 15: best diast · axial · 0.39mm/px · z∈[+1485,+1525]mm · 2 of 299 slices shown]
[im 100/299  vessel]
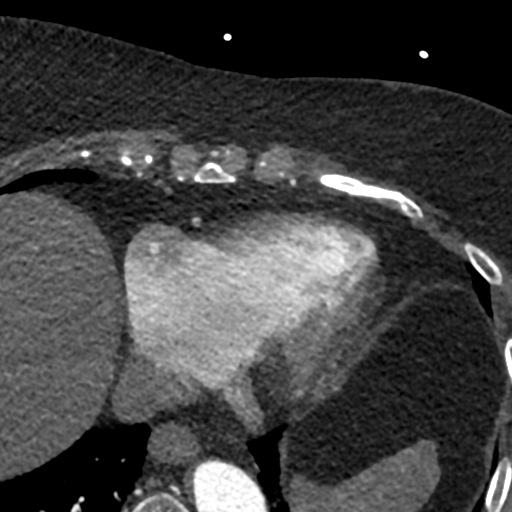
[im 199/299  vessel]
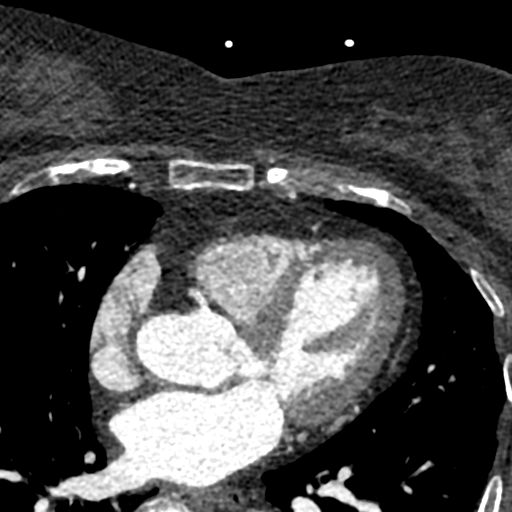

[6 of 20 positions shown; findings below may reference images not displayed]

FINDINGS: Within the visualized portions of the thorax there are no suspicious
appearing pulmonary nodules or masses, there is no acute
consolidative airspace disease, no pleural effusions, no
pneumothorax and no lymphadenopathy. Visualized portions of the
upper abdomen are unremarkable. There are no aggressive appearing
lytic or blastic lesions noted in the visualized portions of the
skeleton.
IMPRESSION: 1. No significant incidental noncardiac findings are noted.
FINDINGS: A 100 kV prospective scan was triggered in the descending thoracic
aorta at 111 HU's. Axial non-contrast 3 mm slices were carried out
through the heart. The data set was analyzed on a dedicated work
station and scored using the Agatson method. Gantry rotation speed
was 250 msecs and collimation was .6 mm. 0.8 mg of sl NTG was given.
The 3D data set was reconstructed in 5% intervals of the 67-82 % of
the R-R cycle. Diastolic phases were analyzed on a dedicated work
station using MPR, MIP and VRT modes. The patient received 80 cc of
contrast.

Image quality: Good.  Misregistration artifact.

Aorta:  Normal size.  No calcifications.  No dissection.

Aortic Valve:  Trileaflet.  No calcifications.

Coronary Arteries:  Normal coronary origin.  Right dominance.

RCA is a large dominant artery that gives rise to PDA and PLA. There
is no plaque.

Left main is a large artery that gives rise to LAD and LCX arteries.

LAD is a large vessel that has no plaque.

LCX is a non-dominant artery that gives rise to one large OM1
branch. There is no plaque.

Other findings:

Normal pulmonary vein drainage into the left atrium.

Normal left atrial appendage without a thrombus.

Normal size of the pulmonary artery.

Please see radiology report for non cardiac findings.
IMPRESSION: 1. Coronary calcium score of 0.

2. Normal coronary origin with right dominance.

3. No evidence of CAD.

CAD-RADS 0. No evidence of CAD (0%). Consider non-atherosclerotic
causes of chest pain.

*** End of Addendum ***
EXAM:
OVER-READ INTERPRETATION  CT CHEST

The following report is an over-read performed by radiologist Dr.
Xavier Dario Polania [REDACTED] on 10/03/2021. This
over-read does not include interpretation of cardiac or coronary
anatomy or pathology. The coronary calcium score/coronary CTA
interpretation by the cardiologist is attached.
FINDINGS: Within the visualized portions of the thorax there are no suspicious
appearing pulmonary nodules or masses, there is no acute
consolidative airspace disease, no pleural effusions, no
pneumothorax and no lymphadenopathy. Visualized portions of the
upper abdomen are unremarkable. There are no aggressive appearing
lytic or blastic lesions noted in the visualized portions of the
skeleton.
IMPRESSION: 1. No significant incidental noncardiac findings are noted.
